# Patient Record
Sex: Male | Born: 1995 | Race: Black or African American | Hispanic: No | Marital: Single | Smoking: Never smoker
Health system: Southern US, Community
[De-identification: ages and names within clinical notes are randomized; demographics above are authoritative.]

---
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
## 2018-04-20 ENCOUNTER — Other Ambulatory Visit: Payer: Self-pay

## 2018-04-20 ENCOUNTER — Encounter (HOSPITAL_COMMUNITY): Payer: Self-pay

## 2018-04-20 ENCOUNTER — Inpatient Hospital Stay (HOSPITAL_COMMUNITY)
Admission: EM | Admit: 2018-04-20 | Discharge: 2018-04-23 | DRG: 482 | Disposition: A | Discharge status: Home / Self Care | Payer: No Typology Code available for payment source | Attending: Orthopedic Surgery | Admitting: Orthopedic Surgery

## 2018-04-20 ENCOUNTER — Emergency Department (HOSPITAL_COMMUNITY): Payer: No Typology Code available for payment source

## 2018-04-20 DIAGNOSIS — S7291XA Unspecified fracture of right femur, initial encounter for closed fracture: Secondary | ICD-10-CM | POA: Diagnosis present

## 2018-04-20 DIAGNOSIS — S72351A Displaced comminuted fracture of shaft of right femur, initial encounter for closed fracture: Principal | ICD-10-CM | POA: Diagnosis present

## 2018-04-20 DIAGNOSIS — T1490XA Injury, unspecified, initial encounter: Secondary | ICD-10-CM

## 2018-04-20 DIAGNOSIS — Y9351 Activity, roller skating (inline) and skateboarding: Secondary | ICD-10-CM | POA: Diagnosis not present

## 2018-04-20 DIAGNOSIS — M25551 Pain in right hip: Secondary | ICD-10-CM | POA: Diagnosis present

## 2018-04-20 DIAGNOSIS — Y92413 State road as the place of occurrence of the external cause: Secondary | ICD-10-CM | POA: Diagnosis not present

## 2018-04-20 LAB — BASIC METABOLIC PANEL
ANION GAP: 12 (ref 5–15)
BUN: 10 mg/dL (ref 6–20)
CO2: 24 mmol/L (ref 22–32)
Calcium: 9.5 mg/dL (ref 8.9–10.3)
Chloride: 105 mmol/L (ref 98–111)
Creatinine, Ser: 0.94 mg/dL (ref 0.61–1.24)
GFR calc Af Amer: >60 mL/min (ref >60)
GFR calc non Af Amer: >60 mL/min (ref >60)
GLUCOSE: 101 mg/dL — AB (ref 70–99)
Potassium: 4.1 mmol/L (ref 3.5–5.1)
Sodium: 141 mmol/L (ref 135–145)

## 2018-04-20 LAB — CBC
HCT: 47.4 % (ref 39.0–52.0)
HEMOGLOBIN: 16 g/dL (ref 13.0–17.0)
MCH: 30 pg (ref 26.0–34.0)
MCHC: 33.8 g/dL (ref 30.0–36.0)
MCV: 88.8 fL (ref 78.0–100.0)
Platelets: 183 10*3/uL (ref 150–400)
RBC: 5.34 MIL/uL (ref 4.22–5.81)
RDW: 12.3 % (ref 11.5–15.5)
WBC: 9.9 10*3/uL (ref 4.0–10.5)

## 2018-04-20 MED ORDER — METHOCARBAMOL 1000 MG/10ML IJ SOLN
500.0000 mg | Freq: Four times a day (QID) | INTRAVENOUS | Status: DC | PRN
  Filled 2018-04-20: qty 5

## 2018-04-20 MED ORDER — ONDANSETRON HCL 4 MG/2ML IJ SOLN
4.0000 mg | Freq: Once | INTRAMUSCULAR | Status: AC
  Administered 2018-04-20: 4 mg via INTRAVENOUS
  Filled 2018-04-20: qty 2

## 2018-04-20 MED ORDER — ACETAMINOPHEN 325 MG PO TABS: 650.0000 mg | ORAL_TABLET | Freq: Four times a day (QID) | ORAL | Status: DC | PRN

## 2018-04-20 MED ORDER — METHOCARBAMOL 500 MG PO TABS
500.0000 mg | ORAL_TABLET | Freq: Four times a day (QID) | ORAL | Status: DC | PRN
  Administered 2018-04-23 (×2): 500 mg via ORAL
  Filled 2018-04-20: qty 1

## 2018-04-20 MED ORDER — HYDROMORPHONE HCL 1 MG/ML IJ SOLN
0.5000 mg | INTRAMUSCULAR | Status: DC | PRN
  Administered 2018-04-22 (×2): 1 mg via INTRAVENOUS
  Filled 2018-04-20 (×2): qty 1

## 2018-04-20 MED ORDER — ACETAMINOPHEN 650 MG RE SUPP: 650.0000 mg | Freq: Four times a day (QID) | RECTAL | Status: DC | PRN

## 2018-04-20 MED ORDER — HYDROMORPHONE HCL 1 MG/ML IJ SOLN
1.0000 mg | Freq: Once | INTRAMUSCULAR | Status: AC
  Administered 2018-04-20: 1 mg via INTRAVENOUS
  Filled 2018-04-20: qty 1

## 2018-04-20 MED ORDER — HYDROCODONE-ACETAMINOPHEN 5-325 MG PO TABS: 1.0000 | ORAL_TABLET | ORAL | Status: DC | PRN

## 2018-04-20 MED ORDER — HYDROMORPHONE HCL 1 MG/ML IJ SOLN
1.0000 mg | INTRAMUSCULAR | Status: DC | PRN
  Administered 2018-04-20 – 2018-04-21 (×5): 1 mg via INTRAVENOUS
  Filled 2018-04-20 (×5): qty 1

## 2018-04-20 MED ORDER — METHOCARBAMOL 1000 MG/10ML IJ SOLN
500.0000 mg | Freq: Four times a day (QID) | INTRAVENOUS | Status: DC
  Administered 2018-04-20 – 2018-04-21 (×3): 500 mg via INTRAVENOUS
  Filled 2018-04-20 (×7): qty 5

## 2018-04-20 MED ORDER — FENTANYL CITRATE (PF) 100 MCG/2ML IJ SOLN: 25.0000 ug | INTRAMUSCULAR | Status: DC | PRN

## 2018-04-20 MED ORDER — SODIUM CHLORIDE 0.9 % IV SOLN
Freq: Once | INTRAVENOUS | Status: AC
  Administered 2018-04-20: 21:00:00 via INTRAVENOUS

## 2018-04-20 MED ORDER — METHOCARBAMOL 1000 MG/10ML IJ SOLN: 500.0000 mg | Freq: Four times a day (QID) | INTRAMUSCULAR | Status: DC

## 2018-04-20 MED ORDER — DOCUSATE SODIUM 100 MG PO CAPS
100.0000 mg | ORAL_CAPSULE | Freq: Two times a day (BID) | ORAL | Status: DC
  Administered 2018-04-20 – 2018-04-21 (×2): 100 mg via ORAL
  Filled 2018-04-20 (×2): qty 1

## 2018-04-20 MED ORDER — POTASSIUM CHLORIDE IN NACL 20-0.45 MEQ/L-% IV SOLN
INTRAVENOUS | Status: DC
  Administered 2018-04-21: 10:00:00 via INTRAVENOUS
  Filled 2018-04-20 (×2): qty 1000

## 2018-04-20 NOTE — ED Triage Notes (Signed)
Pt BIB ems for skateboarding injury to right leg with obvious deformity to femur and severe swelling. Pulses palpable, sensation intact. Pt denies LOC or hitting his head during injury. Pt given fentanyl en route. Pt a.o upon arrival, nad.VSS

## 2018-04-20 NOTE — Progress Notes (Signed)
Orthopedic Tech Progress Note Patient Details:  Mason Hawkins June 08, 1996 [id number]  Musculoskeletal Traction Type of Traction: Bucks Skin Traction Traction Location: rle Traction Weight: 10 lbs   Post Interventions Patient Tolerated: Well Instructions Provided: Care of device, Adjustment of device   Trinna PostMartinez, Starletta Houchin J 04/20/2018, 11:21 PM

## 2018-04-20 NOTE — H&P (Signed)
Mason Hawkins is an 22 y.o. male.    Chief Complaint:  Right femur fracture   HPI: Pt is a 22 y.o. male complaining of right thigh pain after falling while skating.  Immediate onset of pain, inability to bear weight and brought to ER by EMS. ER evaluation revealed right proximal third femur fracture  PCP:  Panosh, Standley Brooking, MD  D/C Plans: To be determined following appropriate treatment plan  PMH: History reviewed. No pertinent past medical history.  PSH: History reviewed. No pertinent surgical history.  Social History:  has no tobacco, alcohol, and drug history on file.  Allergies:  Allergies  Allergen Reactions  . Peanut-Containing Drug Products Other (See Comments)    Scratchy throat     Medications:  (Not in a hospital admission)  Results for orders placed or performed during the hospital encounter of 04/20/18 (from the past 48 hour(s))  CBC     Status: None   Collection Time: 04/20/18  6:50 PM  Result Value Ref Range   WBC 9.9 4.0 - 10.5 K/uL   RBC 5.34 4.22 - 5.81 MIL/uL   Hemoglobin 16.0 13.0 - 17.0 g/dL   HCT 47.4 39.0 - 52.0 %   MCV 88.8 78.0 - 100.0 fL   MCH 30.0 26.0 - 34.0 pg   MCHC 33.8 30.0 - 36.0 g/dL   RDW 12.3 11.5 - 15.5 %   Platelets 183 150 - 400 K/uL    Comment: Performed at [facility] [address], [place], [place] [address]  Basic metabolic panel     Status: Abnormal   Collection Time: 04/20/18  6:50 PM  Result Value Ref Range   Sodium 141 135 - 145 mmol/L   Potassium 4.1 3.5 - 5.1 mmol/L    Comment: HEMOLYSIS AT THIS LEVEL MAY AFFECT RESULT   Chloride 105 98 - 111 mmol/L   CO2 24 22 - 32 mmol/L   Glucose, Bld 101 (H) 70 - 99 mg/dL   BUN 10 6 - 20 mg/dL   Creatinine, Ser 0.94 0.61 - 1.24 mg/dL   Calcium 9.5 8.9 - 10.3 mg/dL   GFR calc non Af Amer >60 >60 mL/min   GFR calc Af Amer >60 >60 mL/min    Comment: (NOTE) The eGFR has been calculated using the CKD EPI equation. This calculation has not been validated in all  clinical situations. eGFR's persistently <60 mL/min signify possible Chronic Kidney Disease.    Anion gap 12 5 - 15    Comment: Performed at [facility] [address], [place], [place] [address]   Dg Chest 1 View  Result Date: 04/20/2018 CLINICAL DATA:  Pain after skateboarding accident. EXAM: CHEST  1 VIEW COMPARISON:  Report from 04/09/2000 FINDINGS: The heart size and mediastinal contours are within normal limits. Both lungs are clear. The visualized skeletal structures are unremarkable. IMPRESSION: No active disease. Electronically Signed   By: Ashley Royalty M.D.   On: 04/20/2018 18:30   Dg Femur, Min 2 Views Right  Result Date: 04/20/2018 CLINICAL DATA:  Right leg pain after skateboarding accident EXAM: RIGHT FEMUR 2 VIEWS COMPARISON:  None. FINDINGS: Acute, closed, comminuted but predominantly transverse fracture of the proximal right femur at the junction of the proximal and middle third. Medial displacement of the distal fracture fragment by 1/2 shaft width is identified with varus and dorsal angulation of the distal fracture fragment. No joint dislocation. Soft tissue swelling is identified. IMPRESSION: Acute, slightly comminuted, medially displaced, varus  and dorsally angulated fracture at the junction of the proximal and middle third of the right femur as above described. Electronically Signed   By: Ashley Royalty M.D.   On: 04/20/2018 18:32    ROS: Review of Systems - Negative except HPI  Physican Exam: Blood pressure 128/76, pulse 100, temperature 98.6 F (37 C), temperature source Oral, resp. rate 16, height _0  (1.6 m), weight 77.1 kg, SpO2 94 %.  Awake alert pain Right LE shortened, swelling NVI RLE General medicall exam per ER, stable No UE injury, deformity or bruising  Assessment/Plan Assessment: Right closed proximal third femur fracture   Plan: Admit Bucks traction for comfort NPO after MN for OR tomorrow Hold DVT chemoprophylaxis SCD LLE Will ask for  availability of Trauma service to address earlier in day   Mason Hawkins. Mason Dame, MD  04/20/2018, 9:38 PM

## 2018-04-20 NOTE — ED Provider Notes (Signed)
[facility] Provider Note   CSN: [id number] Arrival date & time: 04/20/18  1732     History   Chief Complaint Chief Complaint  Patient presents with  . Leg Injury    HPI Mason Hawkins is a 22 y.o. male.  HPI   Mason Hawkins is a 22 year old male with no significant past medical history who presents to the emergency department for evaluation of right leg injury.  Patient reports that he was skateboarding 1 hour prior to arrival when he accidentally hit a small hole in the road.  His right foot stepped in a hole and he immediately felt pain in his right thigh.  He lowered himself to the ground and has been unable to bear weight ever since.  EMS noticed an obvious deformity in his right thigh and he was given fentanyl in route.  Patient reports his pain is improved since being given the fentanyl.  His pain is located in the anterior middle right thigh, worsened with palpation.  Denies hitting his head or losing consciousness.  He denies pain or arthralgias elsewhere.  No open wounds.  She denies numbness, headache, neck pain, back pain, chest pain, shortness of breath, abdominal pain.  He last ate/drink around 12 PM today.  History reviewed. No pertinent past medical history.  Patient Active Problem List   Diagnosis Date Noted  . INFECTION, TOE 10/07/2007    History reviewed. No pertinent surgical history.      Home Medications    Prior to Admission medications   Not on File    Family History No family history on file.  Social History Social History   Tobacco Use  . Smoking status: Not on file  Substance Use Topics  . Alcohol use: Not on file  . Drug use: Not on file     Allergies   Peanut-containing drug products   Review of Systems Review of Systems  Constitutional: Negative for chills and fever.  Eyes: Negative for visual disturbance.  Respiratory: Negative for shortness of breath.   Cardiovascular: Negative  for chest pain.  Gastrointestinal: Negative for abdominal pain, nausea and vomiting.  Genitourinary: Negative for difficulty urinating.  Musculoskeletal: Positive for arthralgias (right thigh) and gait problem. Negative for back pain.  Skin: Negative for color change and wound.  Neurological: Negative for numbness.  Psychiatric/Behavioral: Negative for agitation.     Physical Exam Updated Vital Signs BP 133/74 (BP Location: Right Arm)   Pulse 93   Temp 98.6 F (37 C) (Oral)   Resp 16   Ht 5\' 3"  (1.6 m)   Wt 77.1 kg   SpO2 95%   BMI 30.11 kg/m   Physical Exam  Constitutional: He appears well-developed and well-nourished. No distress.  No acute distress.   HENT:  Head: Normocephalic and atraumatic.  Eyes: Right eye exhibits no discharge. Left eye exhibits no discharge.  Neck: Normal range of motion. Neck supple.  Cardiovascular: Normal rate, regular rhythm and intact distal pulses.  Pulmonary/Chest: Effort normal and breath sounds normal. No stridor. No respiratory distress. He has no wheezes. He has no rales.  Abdominal: Soft. Bowel sounds are normal. There is no tenderness.  Musculoskeletal:  Right thigh swollen and tender to palpation over the mid-thigh. There is palpable deformity over the middle lateral thigh. Compartments soft. No right knee or ankle pain with palpation. DP pulses 2+ and symmetric bilaterally. Sensation to light touch intact in bilateral LE.   Neurological: He is alert. Coordination  normal.  Skin: Skin is warm and dry. He is not diaphoretic.  Psychiatric: He has a normal mood and affect. His behavior is normal.  Nursing note and vitals reviewed.    ED Treatments / Results  Labs (all labs ordered are listed, but only abnormal results are displayed) Labs Reviewed  CBC  BASIC METABOLIC PANEL    EKG EKG Interpretation  Date/Time:  Monday April 20 2018 18:59:52 EDT Ventricular Rate:  100 PR Interval:    QRS Duration: 69 QT  Interval:  315 QTC Calculation: 407 R Axis:   54 Text Interpretation:  Sinus tachycardia ST elev, probable normal early repol pattern Confirmed by Kennis Carina [phone]) on 04/20/2018 7:12:33 PM   Radiology Dg Chest 1 View  Result Date: 04/20/2018 CLINICAL DATA:  Pain after skateboarding accident. EXAM: CHEST  1 VIEW COMPARISON:  Report from 04/09/2000 FINDINGS: The heart size and mediastinal contours are within normal limits. Both lungs are clear. The visualized skeletal structures are unremarkable. IMPRESSION: No active disease. Electronically Signed   By: Tollie Eth M.D.   On: 04/20/2018 18:30   Dg Femur, Min 2 Views Right  Result Date: 04/20/2018 CLINICAL DATA:  Right leg pain after skateboarding accident EXAM: RIGHT FEMUR 2 VIEWS COMPARISON:  None. FINDINGS: Acute, closed, comminuted but predominantly transverse fracture of the proximal right femur at the junction of the proximal and middle third. Medial displacement of the distal fracture fragment by 1/2 shaft width is identified with varus and dorsal angulation of the distal fracture fragment. No joint dislocation. Soft tissue swelling is identified. IMPRESSION: Acute, slightly comminuted, medially displaced, varus and dorsally angulated fracture at the junction of the proximal and middle third of the right femur as above described. Electronically Signed   By: Tollie Eth M.D.   On: 04/20/2018 18:32    Procedures Procedures (including critical care time)  CRITICAL CARE Performed by: Kellie Shropshire   Total critical care time: 35 minutes  Critical care time was exclusive of separately billable procedures and treating other patients.  Critical care was necessary to treat or prevent imminent or life-threatening deterioration.  Critical care was time spent personally by me on the following activities: development of treatment plan with patient and/or surrogate as well as nursing, discussions with consultants, evaluation of patient's  response to treatment, examination of patient, obtaining history from patient or surrogate, ordering and performing treatments and interventions, ordering and review of laboratory studies, ordering and review of radiographic studies, pulse oximetry and re-evaluation of patient's condition.   Medications Ordered in ED Medications  0.9 %  sodium chloride infusion (has no administration in time range)  HYDROmorphone (DILAUDID) injection 1 mg (has no administration in time range)  methocarbamol (ROBAXIN) 500 mg in dextrose 5 % 50 mL IVPB (has no administration in time range)  ondansetron (ZOFRAN) injection 4 mg (4 mg Intravenous Given 04/20/18 1850)  HYDROmorphone (DILAUDID) injection 1 mg (1 mg Intravenous Given 04/20/18 1851)     Initial Impression / Assessment and Plan / ED Course  I have reviewed the triage vital signs and the nursing notes.  Pertinent labs & imaging results that were available during my care of the patient were reviewed by me and considered in my medical decision making (see chart for details).     Patient with closed right femur fracture which is medially displaced at the junction of the proximal and middle third of the femur.  Compartments soft and extremity is neurovascularly intact.  Discussed this patient with orthopedist  Dr. Charlann Boxer who would like patient admitted to the orthopedic service for planned surgical intervention either tonight or tomorrow.  He would like patient kept n.p.o.  I have placed a temporary admission orders per his request.  Chest x-ray, CBC and BMP unremarkable.  Patient informed of this plan and he agrees.  This was a shared visit with Dr. Pilar Plate who also saw the patient and agrees with admission.  Final Clinical Impressions(s) / ED Diagnoses   Final diagnoses:  Closed displaced comminuted fracture of shaft of right femur, initial encounter [facility])    ED Discharge Orders    None       Kellie Shropshire, PA-C 04/20/18 2015    Sabas Sous,  MD 04/20/18 2214

## 2018-04-21 ENCOUNTER — Encounter (HOSPITAL_COMMUNITY)
Admission: EM | Disposition: A | Discharge status: Home / Self Care | Payer: Self-pay | Source: Home / Self Care | Attending: Orthopedic Surgery

## 2018-04-21 ENCOUNTER — Inpatient Hospital Stay (HOSPITAL_COMMUNITY): Payer: No Typology Code available for payment source | Admitting: Anesthesiology

## 2018-04-21 ENCOUNTER — Encounter (HOSPITAL_COMMUNITY): Payer: Self-pay | Admitting: *Deleted

## 2018-04-21 ENCOUNTER — Inpatient Hospital Stay (HOSPITAL_COMMUNITY): Payer: No Typology Code available for payment source

## 2018-04-21 HISTORY — PX: FEMUR IM NAIL: SHX1597

## 2018-04-21 LAB — CBC
HEMATOCRIT: 43.6 % (ref 39.0–52.0)
Hemoglobin: 14.5 g/dL (ref 13.0–17.0)
MCH: 29.6 pg (ref 26.0–34.0)
MCHC: 33.3 g/dL (ref 30.0–36.0)
MCV: 89 fL (ref 78.0–100.0)
Platelets: 178 10*3/uL (ref 150–400)
RBC: 4.9 MIL/uL (ref 4.22–5.81)
RDW: 12.5 % (ref 11.5–15.5)
WBC: 10.5 10*3/uL (ref 4.0–10.5)

## 2018-04-21 LAB — HIV ANTIBODY (ROUTINE TESTING W REFLEX): HIV Screen 4th Generation wRfx: NONREACTIVE

## 2018-04-21 SURGERY — INSERTION, INTRAMEDULLARY ROD, FEMUR
Anesthesia: General | Site: Leg Upper | Laterality: Right

## 2018-04-21 MED ORDER — CEFAZOLIN SODIUM-DEXTROSE 2-3 GM-%(50ML) IV SOLR
INTRAVENOUS | Status: DC | PRN
  Administered 2018-04-21: 2 g via INTRAVENOUS

## 2018-04-21 MED ORDER — FENTANYL CITRATE (PF) 100 MCG/2ML IJ SOLN
INTRAMUSCULAR | Status: AC
  Filled 2018-04-21: qty 2

## 2018-04-21 MED ORDER — ARTIFICIAL TEARS OPHTHALMIC OINT
TOPICAL_OINTMENT | OPHTHALMIC | Status: AC
  Filled 2018-04-21: qty 3.5

## 2018-04-21 MED ORDER — PHENYLEPHRINE 40 MCG/ML (10ML) SYRINGE FOR IV PUSH (FOR BLOOD PRESSURE SUPPORT)
PREFILLED_SYRINGE | INTRAVENOUS | Status: AC
  Filled 2018-04-21: qty 30

## 2018-04-21 MED ORDER — OXYCODONE HCL 5 MG PO TABS: 5.0000 mg | ORAL_TABLET | Freq: Once | ORAL | Status: DC | PRN

## 2018-04-21 MED ORDER — PROPOFOL 10 MG/ML IV BOLUS
INTRAVENOUS | Status: DC | PRN
  Administered 2018-04-21: 200 mg via INTRAVENOUS

## 2018-04-21 MED ORDER — SUGAMMADEX SODIUM 200 MG/2ML IV SOLN
INTRAVENOUS | Status: DC | PRN
  Administered 2018-04-21: 200 mg via INTRAVENOUS

## 2018-04-21 MED ORDER — FENTANYL CITRATE (PF) 250 MCG/5ML IJ SOLN
INTRAMUSCULAR | Status: DC | PRN
  Administered 2018-04-21 (×5): 50 ug via INTRAVENOUS

## 2018-04-21 MED ORDER — 0.9 % SODIUM CHLORIDE (POUR BTL) OPTIME
TOPICAL | Status: DC | PRN
  Administered 2018-04-21: 1000 mL

## 2018-04-21 MED ORDER — SODIUM CHLORIDE 0.9 % IV SOLN
INTRAVENOUS | Status: DC
  Administered 2018-04-22 (×2): via INTRAVENOUS

## 2018-04-21 MED ORDER — MIDAZOLAM HCL 5 MG/5ML IJ SOLN
INTRAMUSCULAR | Status: DC | PRN
  Administered 2018-04-21 (×2): 1 mg via INTRAVENOUS

## 2018-04-21 MED ORDER — SUCCINYLCHOLINE CHLORIDE 200 MG/10ML IV SOSY
PREFILLED_SYRINGE | INTRAVENOUS | Status: AC
  Filled 2018-04-21: qty 20

## 2018-04-21 MED ORDER — PROPOFOL 10 MG/ML IV BOLUS
INTRAVENOUS | Status: AC
  Filled 2018-04-21: qty 20

## 2018-04-21 MED ORDER — ONDANSETRON HCL 4 MG/2ML IJ SOLN
INTRAMUSCULAR | Status: DC | PRN
  Administered 2018-04-21: 4 mg via INTRAVENOUS

## 2018-04-21 MED ORDER — LIDOCAINE 2% (20 MG/ML) 5 ML SYRINGE
INTRAMUSCULAR | Status: AC
  Filled 2018-04-21: qty 10

## 2018-04-21 MED ORDER — ONDANSETRON HCL 4 MG/2ML IJ SOLN
INTRAMUSCULAR | Status: AC
  Filled 2018-04-21: qty 4

## 2018-04-21 MED ORDER — FENTANYL CITRATE (PF) 100 MCG/2ML IJ SOLN
25.0000 ug | INTRAMUSCULAR | Status: DC | PRN
  Administered 2018-04-21 (×2): 50 ug via INTRAVENOUS

## 2018-04-21 MED ORDER — ROCURONIUM BROMIDE 50 MG/5ML IV SOSY
PREFILLED_SYRINGE | INTRAVENOUS | Status: AC
  Filled 2018-04-21: qty 5

## 2018-04-21 MED ORDER — ROCURONIUM BROMIDE 100 MG/10ML IV SOLN
INTRAVENOUS | Status: DC | PRN
  Administered 2018-04-21: 50 mg via INTRAVENOUS

## 2018-04-21 MED ORDER — CEFAZOLIN SODIUM 1 G IJ SOLR
INTRAMUSCULAR | Status: AC
  Filled 2018-04-21: qty 20

## 2018-04-21 MED ORDER — ONDANSETRON HCL 4 MG/2ML IJ SOLN: 4.0000 mg | Freq: Once | INTRAMUSCULAR | Status: DC | PRN

## 2018-04-21 MED ORDER — LIDOCAINE HCL (CARDIAC) PF 100 MG/5ML IV SOSY
PREFILLED_SYRINGE | INTRAVENOUS | Status: DC | PRN
  Administered 2018-04-21: 40 mg via INTRATRACHEAL

## 2018-04-21 MED ORDER — MIDAZOLAM HCL 2 MG/2ML IJ SOLN
INTRAMUSCULAR | Status: AC
  Filled 2018-04-21: qty 2

## 2018-04-21 MED ORDER — CEFAZOLIN SODIUM-DEXTROSE 2-4 GM/100ML-% IV SOLN
2.0000 g | Freq: Four times a day (QID) | INTRAVENOUS | Status: AC
  Administered 2018-04-22 (×2): 2 g via INTRAVENOUS
  Filled 2018-04-21 (×3): qty 100

## 2018-04-21 MED ORDER — LACTATED RINGERS IV SOLN
INTRAVENOUS | Status: DC | PRN
  Administered 2018-04-21 (×2): via INTRAVENOUS

## 2018-04-21 MED ORDER — OXYCODONE HCL 5 MG/5ML PO SOLN: 5.0000 mg | Freq: Once | ORAL | Status: DC | PRN

## 2018-04-21 MED ORDER — DIPHENHYDRAMINE HCL 50 MG/ML IJ SOLN
INTRAMUSCULAR | Status: AC
  Filled 2018-04-21: qty 1

## 2018-04-21 MED ORDER — DIPHENHYDRAMINE HCL 50 MG/ML IJ SOLN
INTRAMUSCULAR | Status: DC | PRN
  Administered 2018-04-21: 12.5 mg via INTRAVENOUS

## 2018-04-21 MED ORDER — FENTANYL CITRATE (PF) 250 MCG/5ML IJ SOLN
INTRAMUSCULAR | Status: AC
  Filled 2018-04-21: qty 5

## 2018-04-21 SURGICAL SUPPLY — 50 items
BIT DRILL 3.8X6 NS (BIT) ×2 IMPLANT
BIT DRILL 5.3 (BIT) ×2 IMPLANT
BIT DRILL 5.3 NS (BIT) ×2 IMPLANT
COVER PERINEAL POST (MISCELLANEOUS) ×3 IMPLANT
DRAPE INCISE IOBAN 66X45 STRL (DRAPES) ×2 IMPLANT
DRAPE STERI IOBAN 125X83 (DRAPES) ×3 IMPLANT
DRAPE SURG 17X23 STRL (DRAPES) ×3 IMPLANT
DRSG ADAPTIC 3X8 NADH LF (GAUZE/BANDAGES/DRESSINGS) ×3 IMPLANT
DRSG MEPILEX BORDER 4X12 (GAUZE/BANDAGES/DRESSINGS) IMPLANT
DRSG MEPILEX BORDER 4X4 (GAUZE/BANDAGES/DRESSINGS) ×6 IMPLANT
DRSG MEPILEX BORDER 4X8 (GAUZE/BANDAGES/DRESSINGS) IMPLANT
DURAPREP 26ML APPLICATOR (WOUND CARE) ×3 IMPLANT
ELECT REM PT RETURN 9FT ADLT (ELECTROSURGICAL) ×3
ELECTRODE REM PT RTRN 9FT ADLT (ELECTROSURGICAL) ×1 IMPLANT
EVACUATOR 1/8 PVC DRAIN (DRAIN) IMPLANT
GAUZE SPONGE 4X4 12PLY STRL (GAUZE/BANDAGES/DRESSINGS) ×3 IMPLANT
GLOVE BIOGEL PI IND STRL 7.5 (GLOVE) ×1 IMPLANT
GLOVE BIOGEL PI IND STRL 8 (GLOVE) ×1 IMPLANT
GLOVE BIOGEL PI INDICATOR 7.5 (GLOVE) ×2
GLOVE BIOGEL PI INDICATOR 8 (GLOVE) ×2
GLOVE ORTHO TXT STRL SZ7.5 (GLOVE) ×3 IMPLANT
GLOVE SURG ORTHO 8.0 STRL STRW (GLOVE) ×3 IMPLANT
GOWN STRL REUS W/ TWL LRG LVL3 (GOWN DISPOSABLE) ×3 IMPLANT
GOWN STRL REUS W/TWL LRG LVL3 (GOWN DISPOSABLE) ×6
GUIDEPIN 3.2X17.5 THRD DISP (PIN) ×2 IMPLANT
GUIDEWIRE BALL NOSE 100CM (WIRE) ×2 IMPLANT
KIT BASIN OR (CUSTOM PROCEDURE TRAY) ×3 IMPLANT
KIT TURNOVER KIT B (KITS) ×3 IMPLANT
LINER BOOT UNIVERSAL DISP (MISCELLANEOUS) ×3 IMPLANT
MANIFOLD NEPTUNE II (INSTRUMENTS) ×3 IMPLANT
NAIL TROCH RH 9X34 (Nail) ×2 IMPLANT
NS IRRIG 1000ML POUR BTL (IV SOLUTION) ×3 IMPLANT
PACK GENERAL/GYN (CUSTOM PROCEDURE TRAY) ×3 IMPLANT
PAD ARMBOARD 7.5X6 YLW CONV (MISCELLANEOUS) ×6 IMPLANT
REAMER ONE STEP 12.2MM (BIT) ×2 IMPLANT
SCREW ACE CORTICAL (Screw) ×2 IMPLANT
SCREW ACE CORTICAL 6.5X70MML (Screw) ×2 IMPLANT
SCREW ACECAP 42MM (Screw) ×2 IMPLANT
SCREW BN FT 60X6.5XST DRV (Screw) IMPLANT
SCREWDRIVER HEX TIP 3.5MM (MISCELLANEOUS) ×2 IMPLANT
STAPLER VISISTAT 35W (STAPLE) ×3 IMPLANT
SUT VIC AB 0 CT1 27 (SUTURE) ×4
SUT VIC AB 0 CT1 27XBRD ANBCTR (SUTURE) ×2 IMPLANT
SUT VIC AB 1 CT1 27 (SUTURE) ×2
SUT VIC AB 1 CT1 27XBRD ANBCTR (SUTURE) ×1 IMPLANT
SUT VIC AB 2-0 CT1 27 (SUTURE) ×2
SUT VIC AB 2-0 CT1 TAPERPNT 27 (SUTURE) ×1 IMPLANT
TOWEL OR 17X24 6PK STRL BLUE (TOWEL DISPOSABLE) ×3 IMPLANT
TOWEL OR 17X26 10 PK STRL BLUE (TOWEL DISPOSABLE) ×3 IMPLANT
WATER STERILE IRR 1000ML POUR (IV SOLUTION) ×3 IMPLANT

## 2018-04-21 NOTE — Interval H&P Note (Signed)
History and Physical Interval Note:  04/21/2018 9:15 PM  Mason Hawkins  has presented today for surgery, with the diagnosis of Right femur fx  The various methods of treatment have been discussed with the patient and family. After consideration of risks, benefits and other options for treatment, the patient has consented to  Procedure(s): INTRAMEDULLARY (IM) NAIL FEMORAL (Right) as a surgical intervention .  The patient's history has been reviewed, patient examined, no change in status, stable for surgery.  I have reviewed the patient's chart and labs.  Questions were answered to the patient's satisfaction.     Shelda PalMatthew D Kelani Robart

## 2018-04-21 NOTE — Progress Notes (Signed)
Patient ID: Mason Hawkins, male   DOB: 02/17/1996, 22 y.o.   MRN: [id number]  Subjective:  A little more comfortable this am with some pain meds     Patient reports pain as moderate.   No other complaints  Objective:   VITALS:   Vitals:   04/20/18 2227 04/21/18 0451  BP: (!) 144/86 122/72  Pulse: 93 100  Resp: 16 16  Temp: 97.9 F (36.6 C) 99.3 F (37.4 C)  SpO2: 98% 98%    Neurovascular intact  In Bucks traction  LABS Recent Labs    04/20/18 1850 04/21/18 0434  HGB 16.0 14.5  HCT 47.4 43.6  WBC 9.9 10.5  PLT 183 178    Recent Labs    04/20/18 1850  NA 141  K 4.1  BUN 10  CREATININE 0.94  GLUCOSE 101*    No results for input(s): LABPT, INR in the last 72 hours.   Assessment/Plan: Right closed proximal femoral shaft fracture   Plan: NPO Will need ORIF Checking on availability of Trauma team If not will address later this pm Risks and benefits reviewed Post op expectations reviewed 

## 2018-04-21 NOTE — Transfer of Care (Signed)
Immediate Anesthesia Transfer of Care Note  Patient: Mason Hawkins  Procedure(s) Performed: INTRAMEDULLARY (IM) NAIL FEMORAL (Right Leg Upper)  Patient Location: PACU  Anesthesia Type:General  Level of Consciousness: drowsy  Airway & Oxygen Therapy: Patient Spontanous Breathing  Post-op Assessment: Report given to RN and Post -op Vital signs reviewed and stable  Post vital signs: Reviewed and stable  Last Vitals:  Vitals Value Taken Time  BP 149/82 04/21/2018 11:30 PM  Temp    Pulse 116 04/21/2018 11:32 PM  Resp 24 04/21/2018 11:32 PM  SpO2 100 % 04/21/2018 11:32 PM  Vitals shown include unvalidated device data.  Last Pain:  Vitals:   04/21/18 1000  TempSrc:   PainSc: 8          Complications: No apparent anesthesia complications

## 2018-04-21 NOTE — Anesthesia Procedure Notes (Signed)
Procedure Name: Intubation Date/Time: 04/21/2018 10:06 PM Performed by: Claudina LickMahony, Zebulan Hinshaw D, CRNA Pre-anesthesia Checklist: Patient identified, Emergency Drugs available, Suction available, Patient being monitored and Timeout performed Patient Re-evaluated:Patient Re-evaluated prior to induction Oxygen Delivery Method: Circle system utilized Preoxygenation: Pre-oxygenation with 100% oxygen Induction Type: IV induction Ventilation: Mask ventilation without difficulty Laryngoscope Size: Miller and 2 Grade View: Grade I Tube type: Oral Tube size: 7.5 mm Number of attempts: 1 Airway Equipment and Method: Stylet Placement Confirmation: ETT inserted through vocal cords under direct vision,  positive ETCO2 and breath sounds checked- equal and bilateral Secured at: 22 cm Tube secured with: Tape Dental Injury: Teeth and Oropharynx as per pre-operative assessment

## 2018-04-21 NOTE — Care Management Note (Signed)
Case Management Note  Patient Details  Name: Mason Hawkins MRN: [id number] Date of Birth: [birth year]  Subjective/Objective:     Right closed proximal femoral shaft fracture               Action/Plan: Independent prior to admission, patient injured himself after falling while skating; [facility]; CM will continue to follow for progression of care.  Expected Discharge Date:    possibly 04/25/2018              Expected Discharge Plan:   Home  Status of Service:   In progress  Reola MosherChandler, Viyaan Champine L, RN,MHA,BSN [phone] 04/21/2018, 12:47 PM

## 2018-04-21 NOTE — Plan of Care (Signed)
  Problem: Education: Goal: Knowledge of General Education information will improve Description: Including pain rating scale, medication(s)/side effects and non-pharmacologic comfort measures Outcome: Progressing   Problem: Pain Managment: Goal: General experience of comfort will improve Outcome: Progressing   Problem: Safety: Goal: Ability to remain free from injury will improve Outcome: Progressing   

## 2018-04-21 NOTE — H&P (View-Only) (Signed)
Patient ID: Mason Hawkins, male   DOB: 15-Dec-1995, 22 y.o.   MRN: [id number]  Subjective:  A little more comfortable this am with some pain meds     Patient reports pain as moderate.   No other complaints  Objective:   VITALS:   Vitals:   04/20/18 2227 04/21/18 0451  BP: (!) 144/86 122/72  Pulse: 93 100  Resp: 16 16  Temp: 97.9 F (36.6 C) 99.3 F (37.4 C)  SpO2: 98% 98%    Neurovascular intact  In Bucks traction  LABS Recent Labs    04/20/18 1850 04/21/18 0434  HGB 16.0 14.5  HCT 47.4 43.6  WBC 9.9 10.5  PLT 183 178    Recent Labs    04/20/18 1850  NA 141  K 4.1  BUN 10  CREATININE 0.94  GLUCOSE 101*    No results for input(s): LABPT, INR in the last 72 hours.   Assessment/Plan: Right closed proximal femoral shaft fracture   Plan: NPO Will need ORIF Checking on availability of Trauma team If not will address later this pm Risks and benefits reviewed Post op expectations reviewed

## 2018-04-21 NOTE — Anesthesia Preprocedure Evaluation (Signed)

## 2018-04-21 NOTE — Brief Op Note (Signed)
04/20/2018 - 04/21/2018  9:17 PM  PATIENT:  Mason Hawkins  22 y.o. male  PRE-OPERATIVE DIAGNOSIS:  Right femur fx  POST-OPERATIVE DIAGNOSIS:  Right closed proximal femoral shaft fracture  PROCEDURE:  Procedure(s): INTRAMEDULLARY (IM) NAIL FEMORAL (Right)  SURGEON:  Surgeon(s) and Role:    Durene Romans* Tejah Brekke, MD - Primary  PHYSICIAN ASSISTANT: None  ANESTHESIA:   general  EBL:  200 cc  BLOOD ADMINISTERED:none  DRAINS: none   LOCAL MEDICATIONS USED:  NONE  SPECIMEN:  No Specimen  DISPOSITION OF SPECIMEN:  N/A  COUNTS:  YES  TOURNIQUET:  * No tourniquets in log *  DICTATION: .Other Dictation: Dictation Number [id number]  PLAN OF CARE: Admit to inpatient   PATIENT DISPOSITION:  PACU - hemodynamically stable.   Delay start of Pharmacological VTE agent (>24hrs) due to surgical blood loss or risk of bleeding: no

## 2018-04-22 ENCOUNTER — Encounter (HOSPITAL_COMMUNITY): Payer: Self-pay | Admitting: Orthopedic Surgery

## 2018-04-22 LAB — BASIC METABOLIC PANEL
Anion gap: 9 (ref 5–15)
BUN: 8 mg/dL (ref 6–20)
CHLORIDE: 101 mmol/L (ref 98–111)
CO2: 24 mmol/L (ref 22–32)
CREATININE: 0.93 mg/dL (ref 0.61–1.24)
Calcium: 8.3 mg/dL — ABNORMAL LOW (ref 8.9–10.3)
GFR calc non Af Amer: >60 mL/min (ref >60)
Glucose, Bld: 106 mg/dL — ABNORMAL HIGH (ref 70–99)
Potassium: 3.7 mmol/L (ref 3.5–5.1)
SODIUM: 134 mmol/L — AB (ref 135–145)

## 2018-04-22 LAB — CBC
HCT: 38.5 % — ABNORMAL LOW (ref 39.0–52.0)
HEMOGLOBIN: 12.9 g/dL — AB (ref 13.0–17.0)
MCH: 29.9 pg (ref 26.0–34.0)
MCHC: 33.5 g/dL (ref 30.0–36.0)
MCV: 89.3 fL (ref 78.0–100.0)
Platelets: 149 10*3/uL — ABNORMAL LOW (ref 150–400)
RBC: 4.31 MIL/uL (ref 4.22–5.81)
RDW: 12.4 % (ref 11.5–15.5)
WBC: 12.8 10*3/uL — ABNORMAL HIGH (ref 4.0–10.5)

## 2018-04-22 MED ORDER — METOCLOPRAMIDE HCL 5 MG PO TABS: 5.0000 mg | ORAL_TABLET | Freq: Three times a day (TID) | ORAL | Status: DC | PRN

## 2018-04-22 MED ORDER — ONDANSETRON HCL 4 MG/2ML IJ SOLN: 4.0000 mg | Freq: Four times a day (QID) | INTRAMUSCULAR | Status: DC | PRN

## 2018-04-22 MED ORDER — ACETAMINOPHEN 500 MG PO TABS
500.0000 mg | ORAL_TABLET | Freq: Four times a day (QID) | ORAL | Status: AC
  Administered 2018-04-22 (×3): 500 mg via ORAL
  Filled 2018-04-22 (×3): qty 1

## 2018-04-22 MED ORDER — ACETAMINOPHEN 325 MG PO TABS: 325.0000 mg | ORAL_TABLET | Freq: Four times a day (QID) | ORAL | Status: DC | PRN

## 2018-04-22 MED ORDER — METHOCARBAMOL 1000 MG/10ML IJ SOLN
500.0000 mg | Freq: Four times a day (QID) | INTRAVENOUS | Status: DC | PRN
  Filled 2018-04-22: qty 5

## 2018-04-22 MED ORDER — MENTHOL 3 MG MT LOZG: 1.0000 | LOZENGE | OROMUCOSAL | Status: DC | PRN

## 2018-04-22 MED ORDER — POLYETHYLENE GLYCOL 3350 17 G PO PACK: 17.0000 g | PACK | Freq: Every day | ORAL | Status: DC | PRN

## 2018-04-22 MED ORDER — METHOCARBAMOL 500 MG PO TABS
500.0000 mg | ORAL_TABLET | Freq: Four times a day (QID) | ORAL | Status: DC | PRN
  Filled 2018-04-22: qty 1

## 2018-04-22 MED ORDER — HYDROCODONE-ACETAMINOPHEN 5-325 MG PO TABS
1.0000 | ORAL_TABLET | ORAL | Status: DC | PRN
  Administered 2018-04-22: 2 via ORAL
  Administered 2018-04-23: 1 via ORAL
  Filled 2018-04-22: qty 1
  Filled 2018-04-22: qty 2

## 2018-04-22 MED ORDER — METOCLOPRAMIDE HCL 5 MG/ML IJ SOLN: 5.0000 mg | Freq: Three times a day (TID) | INTRAMUSCULAR | Status: DC | PRN

## 2018-04-22 MED ORDER — ASPIRIN EC 325 MG PO TBEC
325.0000 mg | DELAYED_RELEASE_TABLET | Freq: Every day | ORAL | Status: DC
  Administered 2018-04-22 – 2018-04-23 (×2): 325 mg via ORAL
  Filled 2018-04-22 (×2): qty 1

## 2018-04-22 MED ORDER — HYDROCODONE-ACETAMINOPHEN 7.5-325 MG PO TABS
1.0000 | ORAL_TABLET | ORAL | Status: DC | PRN
  Administered 2018-04-22 – 2018-04-23 (×3): 2 via ORAL
  Filled 2018-04-22 (×2): qty 1
  Filled 2018-04-22 (×2): qty 2

## 2018-04-22 MED ORDER — DOCUSATE SODIUM 100 MG PO CAPS
100.0000 mg | ORAL_CAPSULE | Freq: Two times a day (BID) | ORAL | Status: DC
  Administered 2018-04-22 – 2018-04-23 (×3): 100 mg via ORAL
  Filled 2018-04-22 (×3): qty 1

## 2018-04-22 MED ORDER — ONDANSETRON HCL 4 MG PO TABS: 4.0000 mg | ORAL_TABLET | Freq: Four times a day (QID) | ORAL | Status: DC | PRN

## 2018-04-22 MED ORDER — ALUM & MAG HYDROXIDE-SIMETH 200-200-20 MG/5ML PO SUSP: 30.0000 mL | ORAL | Status: DC | PRN

## 2018-04-22 MED ORDER — MORPHINE SULFATE (PF) 2 MG/ML IV SOLN
0.5000 mg | INTRAVENOUS | Status: DC | PRN
  Administered 2018-04-22: 1 mg via INTRAVENOUS
  Filled 2018-04-22: qty 1

## 2018-04-22 MED ORDER — PHENOL 1.4 % MT LIQD: 1.0000 | OROMUCOSAL | Status: DC | PRN

## 2018-04-22 NOTE — Plan of Care (Signed)

## 2018-04-22 NOTE — Progress Notes (Signed)
     Subjective: 1 Day Post-Op Procedure(s) (LRB): INTRAMEDULLARY (IM) NAIL FEMORAL (Right)   Patient reports pain as mild, states that he pain is really well controlled. Resting in bed comfortably.  No reported events throughout the night.     Objective:   VITALS:   Vitals:   04/22/18 0023 04/22/18 0421  BP: 130/75 132/72  Pulse: (!) 108 (!) 104  Resp: 16 16  Temp: (!) 100.4 F (38 C) 99.6 F (37.6 C)  SpO2: 97% 99%    Incision: dressing C/D/I No cellulitis present Compartment soft  LABS Recent Labs    04/20/18 1850 04/21/18 0434 04/22/18 0520  HGB 16.0 14.5 12.9*  HCT 47.4 43.6 38.5*  WBC 9.9 10.5 12.8*  PLT 183 178 149*    Recent Labs    04/20/18 1850 04/22/18 0520  NA 141 134*  K 4.1 3.7  BUN 10 8  CREATININE 0.94 0.93  GLUCOSE 101* 106*     Assessment/Plan: 1 Day Post-Op Procedure(s) (LRB): INTRAMEDULLARY (IM) NAIL FEMORAL (Right) Advance diet Up with therapy D/C IV fluids Discharge home, when ready probably tomorrow     Anastasio AuerbachMatthew S. Elianys Conry   PAC  04/22/2018, 7:40 AM

## 2018-04-22 NOTE — Evaluation (Signed)
Physical Therapy Evaluation Patient Details Name: Mason Hawkins O Duffett MRN: [id number] DOB: [birth year] Today's Date: 04/22/2018   History of Present Illness  Pt is a 22 y.o. male admitted 04/20/18 after falling off skateboard sustaining R proximal third femur fx; now s/p IM nail. No pertinent PMH on file.    Clinical Impression  Pt presents with an overall decrease in functional mobility secondary to above. PTA, pt indep, works, and lives with family available for 24/7 support. Educ on precautions, positioning, therex, and importance of mobility. Today, pt able to ambulate with RW at supervision-level; will plan for crutch and stair training next session. Pt would benefit from continued acute PT services to maximize functional mobility and independence prior to d/c home. Recommend follow-up with outpatient orthopedic PT for return to higher level functional and sport activity.    Follow Up Recommendations Outpatient PT;Supervision for mobility/OOB    Equipment Recommendations  Rolling walker with 5" wheels    Recommendations for Other Services       Precautions / Restrictions Precautions Precautions: Fall Restrictions Weight Bearing Restrictions: Yes RLE Weight Bearing: Weight bearing as tolerated      Mobility  Bed Mobility Overal bed mobility: Needs Assistance Bed Mobility: Supine to Sit     Supine to sit: Min assist     General bed mobility comments: MinA to assist RLE to EOB; educ on use of BUEs or LLE to assist  Transfers Overall transfer level: Needs assistance Equipment used: Rolling walker (2 wheeled) Transfers: Sit to/from Stand Sit to Stand: Supervision            Ambulation/Gait Ambulation/Gait assistance: Supervision Gait Distance (Feet): 30 Feet Assistive device: Rolling walker (2 wheeled) Gait Pattern/deviations: Step-to pattern;Decreased weight shift to right;Decreased dorsiflexion - right Gait velocity: Decreased Gait velocity interpretation: <1.8  ft/sec, indicate of risk for recurrent falls General Gait Details: Good ability to amb with RW; encouraged WBAT. Pt walking on forefoot due to pain, encouraged DF and WB through heel as tolerated  Stairs Stairs: (Pt declined this session)          Wheelchair Mobility    Modified Rankin (Stroke Patients Only)       Balance Overall balance assessment: Needs assistance   Sitting balance-Leahy Scale: Good       Standing balance-Leahy Scale: Poor Standing balance comment: Reliant on UE support                             Pertinent Vitals/Pain Pain Assessment: Faces Faces Pain Scale: Hurts little more Pain Location: R thigh Pain Descriptors / Indicators: Sore Pain Intervention(s): Premedicated before session;Limited activity within patient's tolerance    Home Living Family/patient expects to be discharged to:: Private residence Living Arrangements: Parent Available Help at Discharge: Family;Available 24 hours/day Type of Home: House Home Access: Stairs to enter Entrance Stairs-Rails: Right Entrance Stairs-Number of Steps: 3 landing steps + 8 steps w/ rail Home Layout: Two level Home Equipment: Crutches Additional Comments: Lives with parents    Prior Function Level of Independence: Independent         Comments: Works at [employer]. Active     Hand Dominance        Extremity/Trunk Assessment   Upper Extremity Assessment Upper Extremity Assessment: Overall WFL for tasks assessed    Lower Extremity Assessment Lower Extremity Assessment: RLE deficits/detail RLE Deficits / Details: s/p R femoral IM nail; hip flex <3/5, knee flex/ext 3/5 RLE: Unable to fully  assess due to pain;Unable to fully assess due to immobilization       Communication   Communication: No difficulties  Cognition Arousal/Alertness: Awake/alert Behavior During Therapy: WFL for tasks assessed/performed Overall Cognitive Status: Within Functional Limits for tasks  assessed                                        General Comments General comments (skin integrity, edema, etc.): Parents present during session. Educ on ice/elevation    Exercises General Exercises - Lower Extremity Long Arc Quad: AROM;Right;5 reps   Assessment/Plan    PT Assessment Patient needs continued PT services  PT Problem List Decreased strength;Decreased range of motion;Decreased activity tolerance;Decreased balance;Decreased mobility;Decreased knowledge of use of DME;Decreased knowledge of precautions       PT Treatment Interventions DME instruction;Gait training;Stair training;Functional mobility training;Therapeutic activities;Therapeutic exercise;Balance training;Patient/family education    PT Goals (Current goals can be found in the Care Plan section)  Acute Rehab PT Goals Patient Stated Goal: Return home and get back to work PT Goal Formulation: With patient Time For Goal Achievement: 05/06/18 Potential to Achieve Goals: Good    Frequency Min 5X/week   Barriers to discharge        Co-evaluation               AM-PAC PT "6 Clicks" Daily Activity  Outcome Measure Difficulty turning over in bed (including adjusting bedclothes, sheets and blankets)?: None Difficulty moving from lying on back to sitting on the side of the bed? : Unable Difficulty sitting down on and standing up from a chair with arms (e.g., wheelchair, bedside commode, etc,.)?: A Little Help needed moving to and from a bed to chair (including a wheelchair)?: A Little Help needed walking in hospital room?: A Little Help needed climbing 3-5 steps with a railing? : A Little 6 Click Score: 17    End of Session Equipment Utilized During Treatment: Gait belt Activity Tolerance: Patient tolerated treatment well;Patient limited by pain Patient left: in chair;with call bell/phone within reach;with family/visitor present Nurse Communication: Mobility status PT Visit Diagnosis:  Other abnormalities of gait and mobility (R26.89);Pain Pain - Right/Left: Right Pain - part of body: Leg    Time: 1016-1050 PT Time Calculation (min) (ACUTE ONLY): 34 min   Charges:   PT Evaluation $PT Eval Low Complexity: 1 Low PT Treatments $Gait Training: 8-22 mins       Ina Homes, PT, DPT Acute Rehabilitation Services  Pager [phone] Office [phone]  Malachy Chamber 04/22/2018, 2:08 PM

## 2018-04-22 NOTE — Anesthesia Postprocedure Evaluation (Signed)
Anesthesia Post Note  Patient: Mason Hawkins  Procedure(s) Performed: INTRAMEDULLARY (IM) NAIL FEMORAL (Right Leg Upper)     Patient location during evaluation: PACU Anesthesia Type: General Level of consciousness: awake and alert Pain management: pain level controlled Vital Signs Assessment: post-procedure vital signs reviewed and stable Respiratory status: spontaneous breathing, nonlabored ventilation, respiratory function stable and patient connected to nasal cannula oxygen Cardiovascular status: blood pressure returned to baseline and stable Postop Assessment: no apparent nausea or vomiting Anesthetic complications: no    Last Vitals:  Vitals:   04/22/18 0000 04/22/18 0023  BP: (!) 133/58 130/75  Pulse: (!) 112 (!) 108  Resp: (!) 24 16  Temp: 36.7 C (!) 38 C  SpO2: 98% 97%    Last Pain:  Vitals:   04/22/18 0023  TempSrc: Oral  PainSc:                  Charlaine Utsey COKER

## 2018-04-22 NOTE — Op Note (Signed)
NAME: Edilia BoCRUMP, Everest O. MEDICAL RECORD ON:[id number] ACCOUNT [id number] DATE OF BIRTH:1995/09/20 FACILITY: [facility] LOCATION: [facility] PHYSICIAN:Chaske Paskett Rosalia Hammers. Ezequiel Macauley, MD  OPERATIVE REPORT  DATE OF PROCEDURE:  04/21/2018  PREOPERATIVE DIAGNOSIS:  Right proximal one-third femoral shaft fracture, closed.  POSTOPERATIVE DIAGNOSIS:  Right proximal one-third femoral shaft fracture, closed.  PROCEDURE PERFORMED:  Open reduction internal fixation of right femur fracture utilizing a Biomet troch entry, VersaNail 9 mm x 340 mm with a proximal and distal interlocks.  SURGEON:  Durene RomansMatthew Star Resler, MD  ASSISTANT:  Surgical team.  ANESTHESIA:  General.  SPECIMENS:  None.  COMPLICATIONS:  None apparent.  ESTIMATED BLOOD LOSS:  Probably 200 mL.  INDICATIONS:  This is a 22 year old male who had a skateboarding injury on 09/16.  He was brought to the emergency room by EMS and found to have a femur fracture.  He was admitted to the hospital with plan for surgery the next day.  Risks, benefits and  necessity of the procedure were reviewed.  The postoperative course and expectations were discussed.  Risks of infection, malunion, nonunion were reviewed.  Consent obtained for benefit of fracture management.  DESCRIPTION OF PROCEDURE:  The patient was brought to the operative theater.  Once adequate anesthesia, preoperative antibiotics, Ancef administered, he was positioned supine on the fracture table.  The left unaffected extremity was flexed and abducted  out of the way with bony prominences padded, particularly over the peroneal nerve laterally.  The right foot was placed into a boot.  Once positioned safely and padded and perineal post placed, traction was applied across the fracture site.  Fluoroscopy  was used to confirm reduction and position of the extremity.  The right lower extremity was then prepped from proximal iliac crest to below the knee.  Shower curtain technique utilized.  A timeout was performed  identifying the patient, the planned  procedure and extremity.  Fluoroscopy was brought back to the field.  Landmarks were identified.  An incision was made laterally to the trochanter.  Soft tissue dissection was carried through the gluteal fascia for insertion of the guidewire.  Based on  his anatomy, I had to use a starting awl to insert into the tip of the trochanter slightly posterior directed anterior.  I then passed a guidewire and confirmed that had passed into the proximal femur in AP and lateral planes.  I then drilled open the  proximal femur.  Based on the reduction due to traction alone, I was able to pass a guidewire without use of a reduction tool from to the distal tibial physeal scar.  I measured and selected a 340 mm nail.  We then began reaming with a 9 mm reamer and  reamed up to 11 mm with good chatter across the isthmus of the femur.  I selected a 9 mm nail.  The right 9 x 340 mm nail was then inserted with hand as well as impaction to its appropriate depth proximally and distally.  Once this was confirmed, the  guidewire was removed.  Fracture site was anatomic.  The proximal interlock was positioned from greater to the lesser trochanter and then under perfect circle technique, a distal interlock was placed.  Final radiographs were obtained in AP and lateral  planes.  The wounds were irrigated with normal saline solution.  The proximal wound was closed in layers with #1 Vicryl and the gluteal fascia, 2-0 Vicryl and staples, the distal wound with staples alone.  The wounds were then cleaned, dried, and dressed  sterilely  with Mepilex dressings.  He was then extubated and brought to the recovery room in stable condition, tolerating the procedure well.  Postoperatively, he will be weightbearing as tolerated with physical therapy assistance.  Findings were  reviewed with family.  TN/NUANCE  D:04/21/2018 T:04/22/2018 JOB:[id number]

## 2018-04-23 ENCOUNTER — Encounter (HOSPITAL_COMMUNITY): Payer: Self-pay

## 2018-04-23 LAB — BASIC METABOLIC PANEL
ANION GAP: 8 (ref 5–15)
BUN: 6 mg/dL (ref 6–20)
CHLORIDE: 102 mmol/L (ref 98–111)
CO2: 25 mmol/L (ref 22–32)
Calcium: 8.1 mg/dL — ABNORMAL LOW (ref 8.9–10.3)
Creatinine, Ser: 0.9 mg/dL (ref 0.61–1.24)
GFR calc non Af Amer: >60 mL/min (ref >60)
GLUCOSE: 101 mg/dL — AB (ref 70–99)
Potassium: 3.6 mmol/L (ref 3.5–5.1)
Sodium: 135 mmol/L (ref 135–145)

## 2018-04-23 LAB — CBC
HCT: 36.4 % — ABNORMAL LOW (ref 39.0–52.0)
HEMOGLOBIN: 12.2 g/dL — AB (ref 13.0–17.0)
MCH: 29.8 pg (ref 26.0–34.0)
MCHC: 33.5 g/dL (ref 30.0–36.0)
MCV: 89 fL (ref 78.0–100.0)
Platelets: 139 10*3/uL — ABNORMAL LOW (ref 150–400)
RBC: 4.09 MIL/uL — ABNORMAL LOW (ref 4.22–5.81)
RDW: 12.4 % (ref 11.5–15.5)
WBC: 9.1 10*3/uL (ref 4.0–10.5)

## 2018-04-23 MED ORDER — POLYETHYLENE GLYCOL 3350 17 G PO PACK: 17.0000 g | PACK | Freq: Every day | ORAL | 0 refills | Status: AC | PRN

## 2018-04-23 MED ORDER — DOCUSATE SODIUM 100 MG PO CAPS: 100.0000 mg | ORAL_CAPSULE | Freq: Two times a day (BID) | ORAL | 0 refills | Status: AC

## 2018-04-23 MED ORDER — ASPIRIN 325 MG PO TBEC: 325.0000 mg | DELAYED_RELEASE_TABLET | Freq: Every day | ORAL | 0 refills | Status: AC

## 2018-04-23 MED ORDER — HYDROCODONE-ACETAMINOPHEN 7.5-325 MG PO TABS: 1.0000 | ORAL_TABLET | ORAL | 0 refills | Status: AC | PRN

## 2018-04-23 MED ORDER — METHOCARBAMOL 500 MG PO TABS: 500.0000 mg | ORAL_TABLET | Freq: Four times a day (QID) | ORAL | 0 refills | Status: AC | PRN

## 2018-04-23 NOTE — Progress Notes (Deleted)
     Subjective: 2 Days Post-Op Procedure(s) (LRB): INTRAMEDULLARY (IM) NAIL FEMORAL (Right)   Patient reports pain as mild, pain controlled. Resting comfortably in bed. No reported events throughout the night. Ready to be discharged home.    Objective:   VITALS:   Vitals:   04/22/18 2022 04/23/18 0500  BP: 135/79 122/70  Pulse: (!) 117 (!) 105  Resp: 19 18  Temp: 99.3 F (37.4 C) 98.9 F (37.2 C)  SpO2: 91% 95%    Dorsiflexion/Plantar flexion intact Incision: dressing C/D/I No cellulitis present Compartment soft  LABS Recent Labs    04/21/18 0434 04/22/18 0520 04/23/18 0327  HGB 14.5 12.9* 12.2*  HCT 43.6 38.5* 36.4*  WBC 10.5 12.8* 9.1  PLT 178 149* 139*    Recent Labs    04/20/18 1850 04/22/18 0520 04/23/18 0327  NA 141 134* 135  K 4.1 3.7 3.6  BUN 10 8 6   CREATININE 0.94 0.93 0.90  GLUCOSE 101* 106* 101*     Assessment/Plan: 2 Days Post-Op Procedure(s) (LRB): INTRAMEDULLARY (IM) NAIL FEMORAL (Right) Up with therapy Discharge home [facility] Follow up with OLIN,Daquan Crapps D in 2 weeks.  Contact information:  [facility]) [address] [place] [id number] [phone]        Anastasio AuerbachMatthew S. Jadah Bobak   PAC  04/23/2018, 8:59 AM

## 2018-04-23 NOTE — Progress Notes (Signed)
Physical Therapy Treatment Patient Details Name: Mason Hawkins MRN: [id number] DOB: Jun 26, 1996 Today's Date: 04/23/2018    History of Present Illness Pt is a 22 y.o. male admitted 04/20/18 after falling off skateboard sustaining R proximal third femur fx; now s/p IM nail. No pertinent PMH on file.    PT Comments    Pt making steady progress with functional mobility, remains limited secondary to pain. Pt tolerated ambulating an increased distance and initiation of stair training. Pt's mother present throughout, very attentive and asked appropriate questions. PT answered all of pt and pt's mother's questions at end of session. Pt would continue to benefit from skilled physical therapy services at this time while admitted and after d/c to address the below listed limitations in order to improve overall safety and independence with functional mobility.    Follow Up Recommendations  Outpatient PT;Supervision for mobility/OOB     Equipment Recommendations  Rolling walker with 5" wheels    Recommendations for Other Services       Precautions / Restrictions Precautions Precautions: Fall Restrictions Weight Bearing Restrictions: Yes RLE Weight Bearing: Weight bearing as tolerated    Mobility  Bed Mobility Overal bed mobility: Needs Assistance Bed Mobility: Supine to Sit     Supine to sit: Min assist     General bed mobility comments: assist with movement of R LE off of bed  Transfers Overall transfer level: Needs assistance Equipment used: Rolling walker (2 wheeled) Transfers: Sit to/from Stand Sit to Stand: Min guard;Supervision         General transfer comment: pt performed x3 from EOB and x1 from BSC, good technique utilized  Ambulation/Gait Ambulation/Gait assistance: Min Emergency planning/management officer (Feet): 50 Feet(50' x3 with stair training and sitting rest break) Assistive device: Rolling walker (2 wheeled) Gait Pattern/deviations: Step-to pattern;Decreased  weight shift to right;Decreased dorsiflexion - right Gait velocity: Decreased Gait velocity interpretation: <1.31 ft/sec, indicative of household ambulator General Gait Details: pt steady with RW with good technique utilized; encouraged WB'ing through R LE to tolerance   Stairs Stairs: Yes Stairs assistance: Min guard Stair Management: One rail Right;Step to pattern;Sideways Number of Stairs: 2 General stair comments: increased time and effort needed, cueing for sequencing and technique, min guard for safety; pt very limited secondary to pain   Wheelchair Mobility    Modified Rankin (Stroke Patients Only)       Balance Overall balance assessment: Needs assistance Sitting-balance support: Feet supported Sitting balance-Leahy Scale: Good     Standing balance support: During functional activity;Bilateral upper extremity supported Standing balance-Leahy Scale: Poor Standing balance comment: Reliant on UE support                            Cognition Arousal/Alertness: Awake/alert Behavior During Therapy: WFL for tasks assessed/performed Overall Cognitive Status: Within Functional Limits for tasks assessed                                        Exercises      General Comments        Pertinent Vitals/Pain Pain Assessment: 0-10 Pain Score: 8  Pain Location: R thigh Pain Descriptors / Indicators: Sore Pain Intervention(s): Monitored during session;Repositioned;Premedicated before session;Patient requesting pain meds-RN notified    Home Living  Prior Function            PT Goals (current goals can now be found in the care plan section) Acute Rehab PT Goals PT Goal Formulation: With patient Time For Goal Achievement: 05/06/18 Potential to Achieve Goals: Good Progress towards PT goals: Progressing toward goals    Frequency    Min 5X/week      PT Plan Current plan remains appropriate     Co-evaluation              AM-PAC PT "6 Clicks" Daily Activity  Outcome Measure  Difficulty turning over in bed (including adjusting bedclothes, sheets and blankets)?: A Little Difficulty moving from lying on back to sitting on the side of the bed? : Unable Difficulty sitting down on and standing up from a chair with arms (e.g., wheelchair, bedside commode, etc,.)?: Unable Help needed moving to and from a bed to chair (including a wheelchair)?: A Little Help needed walking in hospital room?: A Little Help needed climbing 3-5 steps with a railing? : A Little 6 Click Score: 14    End of Session Equipment Utilized During Treatment: Gait belt Activity Tolerance: Patient limited by pain Patient left: in chair;with call bell/phone within reach;with family/visitor present Nurse Communication: Mobility status;Patient requests pain meds PT Visit Diagnosis: Other abnormalities of gait and mobility (R26.89);Pain Pain - Right/Left: Right Pain - part of body: Leg     Time: 5284-13240924-1017 PT Time Calculation (min) (ACUTE ONLY): 53 min  Charges:  $Gait Training: 23-37 mins $Therapeutic Activity: 23-37 mins                     Deborah ChalkJennifer Jerrad Mendibles, PT, DPT  Acute Rehabilitation Services Pager [phone] Office [phone]     Alessandra BevelsJennifer M Khalise Billard 04/23/2018, 10:50 AM

## 2018-04-23 NOTE — Progress Notes (Signed)
Discharge instructions given to pt and his mother. Verbalized understanding. Right hip dressing dry and intact. Pt was ambulating with the walker, standby assist. Discharged to home accompanied by his parents.

## 2018-04-23 NOTE — Care Management Note (Signed)
Case Management Note  Patient Details  Name: Mason Hawkins MRN: [id number] Date of Birth: September 05, 1995  Subjective/Objective:   Sustaining R proximal third femur fx, s/p IM Nail                 Action/Plan: NCM spoke to pt. Pt's mother will be there to assist pt. Contacted [facility] for RW and 3n1 bedside commode for home. Contacted Ortho tech for crutches for home.   Expected Discharge Date:  04/23/18               Expected Discharge Plan:  Home/Self Care  In-House Referral:  NA  Discharge planning Services  CM Consult  Post Acute Care Choice:  NA Choice offered to:  NA  DME Arranged:  3-N-1, Walker rolling DME Agency:  [facility]  HH Arranged:  NA HH Agency:  NA  Status of Service:  Completed, signed off  If discussed at Long Length of Stay Meetings, dates discussed:    Additional Comments:  [name], [name], RN 04/23/2018, 12:13 PM

## 2018-04-23 NOTE — Progress Notes (Signed)
Orthopedic Tech Progress Note Patient Details:  Mason Hawkins 1995/09/21 [id number]  Ortho Devices Type of Ortho Device: Crutches Ortho Device/Splint Interventions: Application   Post Interventions Patient Tolerated: Well Instructions Provided: Care of device   Mason Hawkins, Mason Hawkins 04/23/2018, 11:02 AM

## 2018-04-23 NOTE — Progress Notes (Addendum)
     Subjective: 2 Days Post-Op Procedure(s) (LRB): INTRAMEDULLARY (IM) NAIL FEMORAL (Right)   Patient reports pain as mild, pain controlled. No events throughout the night. Feels that he is doing well and ready to be discharged home.    Objective:   VITALS:   Vitals:   04/22/18 2022 04/23/18 0500  BP: 135/79 122/70  Pulse: (!) 117 (!) 105  Resp: 19 18  Temp: 99.3 F (37.4 C) 98.9 F (37.2 C)  SpO2: 91% 95%    Dorsiflexion/Plantar flexion intact Incision: dressing C/D/I No cellulitis present Compartment soft  LABS Recent Labs    04/21/18 0434 04/22/18 0520 04/23/18 0327  HGB 14.5 12.9* 12.2*  HCT 43.6 38.5* 36.4*  WBC 10.5 12.8* 9.1  PLT 178 149* 139*    Recent Labs    04/20/18 1850 04/22/18 0520 04/23/18 0327  NA 141 134* 135  K 4.1 3.7 3.6  BUN 10 8 6   CREATININE 0.94 0.93 0.90  GLUCOSE 101* 106* 101*     Assessment/Plan: 2 Days Post-Op Procedure(s) (LRB): INTRAMEDULLARY (IM) NAIL FEMORAL (Right) Up with therapy Discharge home [facility] Follow up with OLIN,Gracelynn Bircher D in 2 weeks.  Contact information:  [facility]) [address] [place] [id number] [phone]        Anastasio AuerbachMatthew S. Quintavia Rogstad   PAC  04/23/2018, 8:58 AM

## 2018-04-28 NOTE — Discharge Summary (Signed)
Physician Discharge Summary  Patient ID: Mason Hawkins MRN: [id number] DOB/AGE: 1996/02/21 22 y.o.  Admit date: 04/20/2018 Discharge date: 04/23/2018   Procedures:  Procedure(s) (LRB): INTRAMEDULLARY (IM) NAIL FEMORAL (Right)  Attending Physician:  Dr. Durene Romans   Admission Diagnoses:   Right femur fracture   Discharge Diagnoses:  Active Problems:   Femur fracture, right (HCC)   Closed fracture of right femur, unspecified fracture morphology, initial encounter (HCC)  History reviewed. No pertinent past medical history.  HPI:    Pt is a 22 y.o. male complaining of right thigh pain after falling while skating.  Immediate onset of pain, inability to bear weight and brought to ER by EMS.  ER evaluation revealed right proximal third femur fracture.  PCP: Madelin Headings, MD   Discharged Condition: good  Hospital Course:  Patient was admitted to the hospital on 04/21/2018.  Patient had an uneventful course and underwent the above stated procedure on 04/21/2018. Patient tolerated the procedure well and brought to the recovery room in good condition and subsequently to the floor.  POD #1 BP: 132/72 ; Pulse: 104 ; Temp: 99.6 F (37.6 C) ; Resp: 16 Patient reports pain as mild, states that he pain is really well controlled. Resting in bed comfortably.  No reported events throughout the night.  Dorsiflexion/plantar flexion intact, incision: dressing C/D/I, no cellulitis present and compartment soft.   LABS  Basename    HGB     12.9  HCT     38.5   POD #2  BP: 122/70 ; Pulse: 105 ; Temp: 98.9 F (37.2 C) ; Resp: 18 Patient reports pain as mild, pain controlled. No events throughout the night. Feels that he is doing well and ready to be discharged home.   LABS  Basename    HGB     12.2  HCT     36.4    Discharge Exam: General appearance: alert, cooperative and no distress Extremities: Homans sign is negative, no sign of DVT, no edema, redness or tenderness in the calves or  thighs and no ulcers, gangrene or trophic changes  Disposition:  Home with follow up in 2 weeks   Follow-up Information    Durene Romans, MD. Schedule an appointment as soon as possible for a visit in 2 week(s).   Specialty:  Orthopedic Surgery Contact information: [address] [place] [address] [phone]        [facility] - Dme Follow up.   Why:  will deliver Rolling Walker and 3n1 bedside commode to room prior to discharge Contact information: [address] [place] [address] [phone]           Discharge Instructions    Call MD / Call 911   Complete by:  As directed    If you experience chest pain or shortness of breath, CALL 911 and be transported to the hospital emergency room.  If you develope a fever above 101 F, pus (white drainage) or increased drainage or redness at the wound, or calf pain, call your surgeon's office.   Constipation Prevention   Complete by:  As directed    Drink plenty of fluids.  Prune juice may be helpful.  You may use a stool softener, such as Colace (over the counter) 100 mg twice a day.  Use MiraLax (over the counter) for constipation as needed.   Diet - low sodium heart healthy   Complete by:  As directed  Discharge instructions   Complete by:  As directed    Maintain surgical dressing for 2-3 days.  After do daily dressing changes with gauze and tape, must keep the areas dry and clean.  Follow up in 2 weeks at [facility]. Call with any questions or concerns.   Increase activity slowly as tolerated   Complete by:  As directed    Weight bearing as tolerated with assist device (walker, cane, etc) as directed, use it as long as suggested by your surgeon or therapist, typically at least 4-6 weeks.      Allergies as of 04/23/2018      Reactions   Peanut-containing Drug Products Other (See Comments)   Scratchy throat      Medication List    TAKE these medications     aspirin 325 MG EC tablet Take 1 tablet (325 mg total) by mouth daily with breakfast.   docusate sodium 100 MG capsule Commonly known as:  COLACE Take 1 capsule (100 mg total) by mouth 2 (two) times daily.   HYDROcodone-acetaminophen 7.5-325 MG tablet Commonly known as:  NORCO Take 1-2 tablets by mouth every 4 (four) hours as needed for moderate pain.   methocarbamol 500 MG tablet Commonly known as:  ROBAXIN Take 1 tablet (500 mg total) by mouth every 6 (six) hours as needed for muscle spasms.   polyethylene glycol packet Commonly known as:  MIRALAX / GLYCOLAX Take 17 g by mouth daily as needed for mild constipation or moderate constipation.        Signed: Anastasio AuerbachMatthew S. Mykell Genao   PA-C  04/28/2018, 10:04 AM

## 2019-06-20 ENCOUNTER — Other Ambulatory Visit: Payer: Self-pay | Admitting: Cardiology

## 2019-06-20 DIAGNOSIS — Z20822 Contact with and (suspected) exposure to covid-19: Secondary | ICD-10-CM

## 2019-06-21 LAB — NOVEL CORONAVIRUS, NAA: SARS-CoV-2, NAA: DETECTED — AB

## 2019-06-23 ENCOUNTER — Telehealth: Payer: Self-pay | Admitting: Critical Care Medicine

## 2019-06-23 NOTE — Telephone Encounter (Signed)
This is a 23 year old male who was positive for Covid on November 15 testing event The patient has loss of taste and smell and did have low-grade fever initially but this is resolved.  His symptoms began on 13 November.  He notes that his mother is in the ICU with Covid 19 infection and is on the ventilator machine.  His father is doing well and is Covid negative.  Patient knows the health department may be in touch.  I gave him isolation time to include being out of isolation by November 24 provided he has no fever the 3 days prior to that.  Also gave him advice as to supplements he could take to shorten the course of the virus.  I asked him to go to the emergency room if he gets dramatically worse with the shortness of breath and fever is spiking  The patient states he does not have a primary care provider at this time

## 2019-06-29 ENCOUNTER — Other Ambulatory Visit: Payer: Self-pay | Admitting: Cardiology

## 2019-06-29 DIAGNOSIS — Z20822 Contact with and (suspected) exposure to covid-19: Secondary | ICD-10-CM

## 2019-07-01 LAB — NOVEL CORONAVIRUS, NAA: SARS-CoV-2, NAA: NOT DETECTED

## 2020-05-25 ENCOUNTER — Other Ambulatory Visit: Payer: Self-pay

## 2020-05-25 ENCOUNTER — Ambulatory Visit (HOSPITAL_COMMUNITY)
Admission: EM | Admit: 2020-05-25 | Discharge: 2020-05-25 | Disposition: A | Discharge status: Home / Self Care | Payer: BC Managed Care – PPO | Attending: Family Medicine | Admitting: Family Medicine

## 2020-05-25 ENCOUNTER — Encounter (HOSPITAL_COMMUNITY): Payer: Self-pay

## 2020-05-25 DIAGNOSIS — R590 Localized enlarged lymph nodes: Secondary | ICD-10-CM | POA: Diagnosis not present

## 2020-05-25 NOTE — Discharge Instructions (Signed)
Please try heat on the area  You may need an ultrasound or blood work  It may be a lymph node or a lipoma.  Please follow up if your symptoms fail to improve.

## 2020-05-25 NOTE — ED Triage Notes (Signed)
Pt present cyst on the back of his head. Pt states this is a recurrent issue. It hurts when he moves his head and today its very sore.

## 2020-05-25 NOTE — ED Provider Notes (Signed)
[facility]    CSN: [id number] Arrival date & time: 05/25/20  1752      History   Chief Complaint Chief Complaint  Patient presents with  . Cyst    on back of head    HPI Deanthony Maull. is a 24 y.o. male.   He is presenting with a lump in the back of his scalp and upper neck.  Has been there for about a year.  He does have some soreness over the area.  Denies any drainage.  No history of trauma.  It is intermittent in nature.  HPI  History reviewed. No pertinent past medical history.  Patient Active Problem List   Diagnosis Date Noted  . Femur fracture, right (HCC) 04/20/2018  . Closed fracture of right femur, unspecified fracture morphology, initial encounter (HCC) 04/20/2018  . INFECTION, TOE 10/07/2007    Past Surgical History:  Procedure Laterality Date  . FEMUR IM NAIL Right 04/21/2018   Procedure: INTRAMEDULLARY (IM) NAIL FEMORAL;  Surgeon: Durene Romans, MD;  Location: [facility] OR;  Service: Orthopedics;  Laterality: Right;       Home Medications    Prior to Admission medications   Medication Sig Start Date End Date Taking? Authorizing Provider  docusate sodium (COLACE) 100 MG capsule Take 1 capsule (100 mg total) by mouth 2 (two) times daily. 04/23/18   Lanney Gins, PA-C  HYDROcodone-acetaminophen (NORCO) 7.5-325 MG tablet Take 1-2 tablets by mouth every 4 (four) hours as needed for moderate pain. 04/23/18   Lanney Gins, PA-C  methocarbamol (ROBAXIN) 500 MG tablet Take 1 tablet (500 mg total) by mouth every 6 (six) hours as needed for muscle spasms. 04/23/18   Lanney Gins, PA-C  polyethylene glycol (MIRALAX / GLYCOLAX) packet Take 17 g by mouth daily as needed for mild constipation or moderate constipation. 04/23/18   Lanney Gins, PA-C    Family History History reviewed. No pertinent family history.  Social History Social History   Tobacco Use  . Smoking status: Never Smoker  . Smokeless tobacco: Never Used  Substance Use Topics   . Alcohol use: Never  . Drug use: Never     Allergies   Peanut-containing drug products   Review of Systems Review of Systems  See HPI  Physical Exam Triage Vital Signs ED Triage Vitals  Enc Vitals Group     BP 05/25/20 1838 120/73     Pulse Rate 05/25/20 1838 76     Resp 05/25/20 1838 16     Temp 05/25/20 1838 98.6 F (37 C)     Temp Source 05/25/20 1838 Oral     SpO2 05/25/20 1838 99 %     Weight --      Height --      Head Circumference --      Peak Flow --      Pain Score 05/25/20 1839 5     Pain Loc --      Pain Edu? --      Excl. in GC? --    No data found.  Updated Vital Signs BP 120/73 (BP Location: Right Arm)   Pulse 76   Temp 98.6 F (37 C) (Oral)   Resp 16   SpO2 99%   Visual Acuity Right Eye Distance:   Left Eye Distance:   Bilateral Distance:    Right Eye Near:   Left Eye Near:    Bilateral Near:     Physical Exam Gen: NAD, alert, cooperative with exam, well-appearing  ENT: normal lips, normal nasal mucosa, mobile lymph node in the posterior neck. Eye: normal EOM, normal conjunctiva and lids  Skin: no rashes, no areas of induration  Neuro: normal tone, normal sensation to touch Psych:  normal insight, alert and oriented    UC Treatments / Results  Labs (all labs ordered are listed, but only abnormal results are displayed) Labs Reviewed - No data to display  EKG   Radiology No results found.  Procedures Procedures (including critical care time)  Medications Ordered in UC Medications - No data to display  Initial Impression / Assessment and Plan / UC Course  I have reviewed the triage vital signs and the nursing notes.  Pertinent labs & imaging results that were available during my care of the patient were reviewed by me and considered in my medical decision making (see chart for details).     Mr. Esquivias is a 24 year old male that is presenting with symptoms suggestive of a solitary cervical lymph node.  Has been having  this for about a year.  It fluctuates in size.  Causes pain intermittently.  No fevers or chills.  Counseled supportive care.  Given indications on follow-up.  Final Clinical Impressions(s) / UC Diagnoses   Final diagnoses:  Cervical lymphadenopathy     Discharge Instructions     Please try heat on the area  You may need an ultrasound or blood work  It may be a lymph node or a lipoma.  Please follow up if your symptoms fail to improve.     ED Prescriptions    None     PDMP not reviewed this encounter.   Myra Rude, MD 05/25/20 [phone]

## 2020-05-30 ENCOUNTER — Other Ambulatory Visit: Payer: Self-pay | Admitting: Adult Health Nurse Practitioner

## 2020-05-30 DIAGNOSIS — R599 Enlarged lymph nodes, unspecified: Secondary | ICD-10-CM

## 2020-06-01 ENCOUNTER — Ambulatory Visit
Admission: RE | Admit: 2020-06-01 | Discharge: 2020-06-01 | Disposition: A | Discharge status: Home / Self Care | Payer: BC Managed Care – PPO | Source: Ambulatory Visit | Attending: Adult Health Nurse Practitioner | Admitting: Adult Health Nurse Practitioner

## 2020-06-01 DIAGNOSIS — R599 Enlarged lymph nodes, unspecified: Secondary | ICD-10-CM

## 2021-01-19 ENCOUNTER — Encounter (HOSPITAL_COMMUNITY): Payer: Self-pay

## 2021-01-19 ENCOUNTER — Ambulatory Visit (INDEPENDENT_AMBULATORY_CARE_PROVIDER_SITE_OTHER): Payer: BC Managed Care – PPO

## 2021-01-19 ENCOUNTER — Ambulatory Visit (HOSPITAL_COMMUNITY)
Admission: EM | Admit: 2021-01-19 | Discharge: 2021-01-19 | Disposition: A | Discharge status: Home / Self Care | Payer: BC Managed Care – PPO | Attending: Emergency Medicine | Admitting: Emergency Medicine

## 2021-01-19 ENCOUNTER — Other Ambulatory Visit: Payer: Self-pay

## 2021-01-19 DIAGNOSIS — M25571 Pain in right ankle and joints of right foot: Secondary | ICD-10-CM | POA: Diagnosis not present

## 2021-01-19 DIAGNOSIS — S93401A Sprain of unspecified ligament of right ankle, initial encounter: Secondary | ICD-10-CM | POA: Diagnosis not present

## 2021-01-19 MED ORDER — MELOXICAM 15 MG PO TABS: 15.0000 mg | ORAL_TABLET | Freq: Every day | ORAL | 0 refills | Status: AC

## 2021-01-19 NOTE — ED Provider Notes (Signed)
[facility]  ____________________________________________  Time seen: Approximately 9:39 AM  I have reviewed the triage vital signs and the nursing notes.   HISTORY  Chief Complaint Ankle Pain and Foot Pain   Historian Patient     HPI Mason Errico. is a 25 y.o. male with a history of prior right femur fracture, presents to the emergency department with a right lateral ankle and dorsal foot pain.  Patient accidentally put his foot through a wooden stair yesterday and sustained an inversion type ankle injury.  Patient has had difficulty bearing weight since injury occurred.  No abrasions or lacerations.  No prior right ankle sprains in the past.  No other alleviating measures have been attempted.   History reviewed. No pertinent past medical history.   Immunizations up to date:  Yes.     History reviewed. No pertinent past medical history.  Patient Active Problem List   Diagnosis Date Noted   Femur fracture, right (HCC) 04/20/2018   Closed fracture of right femur, unspecified fracture morphology, initial encounter (HCC) 04/20/2018   INFECTION, TOE 10/07/2007    Past Surgical History:  Procedure Laterality Date   FEMUR IM NAIL Right 04/21/2018   Procedure: INTRAMEDULLARY (IM) NAIL FEMORAL;  Surgeon: Durene Romans, MD;  Location: [facility] OR;  Service: Orthopedics;  Laterality: Right;    Prior to Admission medications   Medication Sig Start Date End Date Taking? Authorizing Provider  meloxicam (MOBIC) 15 MG tablet Take 1 tablet (15 mg total) by mouth daily for 7 days. 01/19/21 01/26/21 Yes Pia Mau M, PA-C  docusate sodium (COLACE) 100 MG capsule Take 1 capsule (100 mg total) by mouth 2 (two) times daily. 04/23/18   Lanney Gins, PA-C  HYDROcodone-acetaminophen (NORCO) 7.5-325 MG tablet Take 1-2 tablets by mouth every 4 (four) hours as needed for moderate pain. 04/23/18   Lanney Gins, PA-C  methocarbamol (ROBAXIN) 500 MG tablet Take 1 tablet (500 mg  total) by mouth every 6 (six) hours as needed for muscle spasms. 04/23/18   Lanney Gins, PA-C  polyethylene glycol (MIRALAX / GLYCOLAX) packet Take 17 g by mouth daily as needed for mild constipation or moderate constipation. 04/23/18   Lanney Gins, PA-C    Allergies Peanut-containing drug products  History reviewed. No pertinent family history.  Social History Social History   Tobacco Use   Smoking status: Never   Smokeless tobacco: Never  Substance Use Topics   Alcohol use: Never   Drug use: Never     Review of Systems  Constitutional: No fever/chills Eyes:  No discharge ENT: No upper respiratory complaints. Respiratory: no cough. No SOB/ use of accessory muscles to breath Gastrointestinal:   No nausea, no vomiting.  No diarrhea.  No constipation. Musculoskeletal: Patient has right foot and ankle pain.  Skin: Negative for rash, abrasions, lacerations, ecchymosis.    ____________________________________________   PHYSICAL EXAM:  VITAL SIGNS: ED Triage Vitals  Enc Vitals Group     BP 01/19/21 0912 (!) 141/91     Pulse Rate 01/19/21 0912 84     Resp 01/19/21 0912 18     Temp 01/19/21 0921 98.8 F (37.1 C)     Temp Source 01/19/21 0912 Oral     SpO2 01/19/21 0912 95 %     Weight --      Height --      Head Circumference --      Peak Flow --      Pain Score 01/19/21 0910 10  Pain Loc --      Pain Edu? --      Excl. in GC? --      Constitutional: Alert and oriented. Well appearing and in no acute distress. Eyes: Conjunctivae are normal. PERRL. EOMI. Head: Atraumatic. ENT:  Cardiovascular: Normal rate, regular rhythm. Normal S1 and S2.  Good peripheral circulation. Respiratory: Normal respiratory effort without tachypnea or retractions. Lungs CTAB. Good air entry to the bases with no decreased or absent breath sounds Gastrointestinal: Bowel sounds x 4 quadrants. Soft and nontender to palpation. No guarding or rigidity. No  distention. Musculoskeletal: Patient has tenderness to palpation over anterior and posterior talofibular ligaments.  No pain to palpation over the deltoid ligament.  Palpable dorsalis pedis pulse bilaterally and symmetrically.  Capillary refill less than 2 seconds on the right. Neurologic:  Normal for age. No gross focal neurologic deficits are appreciated.  Skin:  Skin is warm, dry and intact. No rash noted. Psychiatric: Mood and affect are normal for age. Speech and behavior are normal.   ____________________________________________   LABS (all labs ordered are listed, but only abnormal results are displayed)  Labs Reviewed - No data to display ____________________________________________  EKG   ____________________________________________  RADIOLOGY Geraldo Pitter, personally viewed and evaluated these images (plain radiographs) as part of my medical decision making, as well as reviewing the written report by the radiologist.   DG Ankle Complete Right  Result Date: 01/19/2021 CLINICAL DATA:  Pain following rolling injury EXAM: RIGHT ANKLE - COMPLETE 3+ VIEW COMPARISON:  None. FINDINGS: Frontal, oblique, and lateral views were obtained. There is no fracture or joint effusion. Joint spaces appear normal. No erosive change. Ankle mortise appears intact. IMPRESSION: No fracture or appreciable arthropathy. Ankle mortise appears intact. Electronically Signed   By: Bretta Bang III M.D.   On: 01/19/2021 09:54   DG Foot Complete Right  Result Date: 01/19/2021 CLINICAL DATA:  Pain following rolling type injury EXAM: RIGHT FOOT COMPLETE - 3+ VIEW COMPARISON:  None. FINDINGS: Frontal, oblique, and lateral views were obtained. There is no fracture or dislocation. Joint spaces appear normal. No erosive change. IMPRESSION: No fracture or dislocation.  No evident arthropathy. Electronically Signed   By: Bretta Bang III M.D.   On: 01/19/2021 09:53     ____________________________________________    PROCEDURES  Procedure(s) performed:     Procedures     Medications - No data to display   ____________________________________________   INITIAL IMPRESSION / ASSESSMENT AND PLAN / ED COURSE  Pertinent labs & imaging results that were available during my care of the patient were reviewed by me and considered in my medical decision making (see chart for details).      Assessment and Plan: Right foot pain:  Right ankle pain:  26 year old male presents to the urgent care with right foot and right ankle pain after he accidentally stepped his foot through a wooden step.  Vital signs are reassuring at triage.  On physical exam, patient was alert, active and nontoxic-appearing.  X-rays of the right foot and right ankle were obtained in the urgent care  No bony abnormalities were visualized on x-rays.  A lace up ankle brace was given in the urgent care and patient was started on meloxicam.  A work note was provided.  All patient questions were answered.   ____________________________________________  FINAL CLINICAL IMPRESSION(S) / ED DIAGNOSES  Final diagnoses:  Sprain of right ankle, unspecified ligament, initial encounter      NEW MEDICATIONS STARTED  DURING THIS VISIT:  ED Discharge Orders          Ordered    meloxicam (MOBIC) 15 MG tablet  Daily        01/19/21 1005                This chart was dictated using voice recognition software/Dragon. Despite best efforts to proofread, errors can occur which can change the meaning. Any change was purely unintentional.     Orvil Feil, PA-C 01/19/21 1008

## 2021-01-19 NOTE — Discharge Instructions (Addendum)
Take meloxicam once daily for pain and inflammation. Apply ice and elevate nightly. Please follow-up with podiatry as needed.

## 2021-01-19 NOTE — ED Triage Notes (Signed)
Pt in with c/o right ankle/foot that happened yesterday when his foot went through his wooden stairs and twisted   Pt states he noticed bruising and swelling on the top of his foot and ankle

## 2023-02-16 IMAGING — DX DG ANKLE COMPLETE 3+V*R*
3 series · 3 of 3 positions shown · non-contrast
Comparison: None.

CLINICAL DATA: Pain following rolling injury

EXAM:
RIGHT ANKLE - COMPLETE 3+ VIEW

[ankle ap]
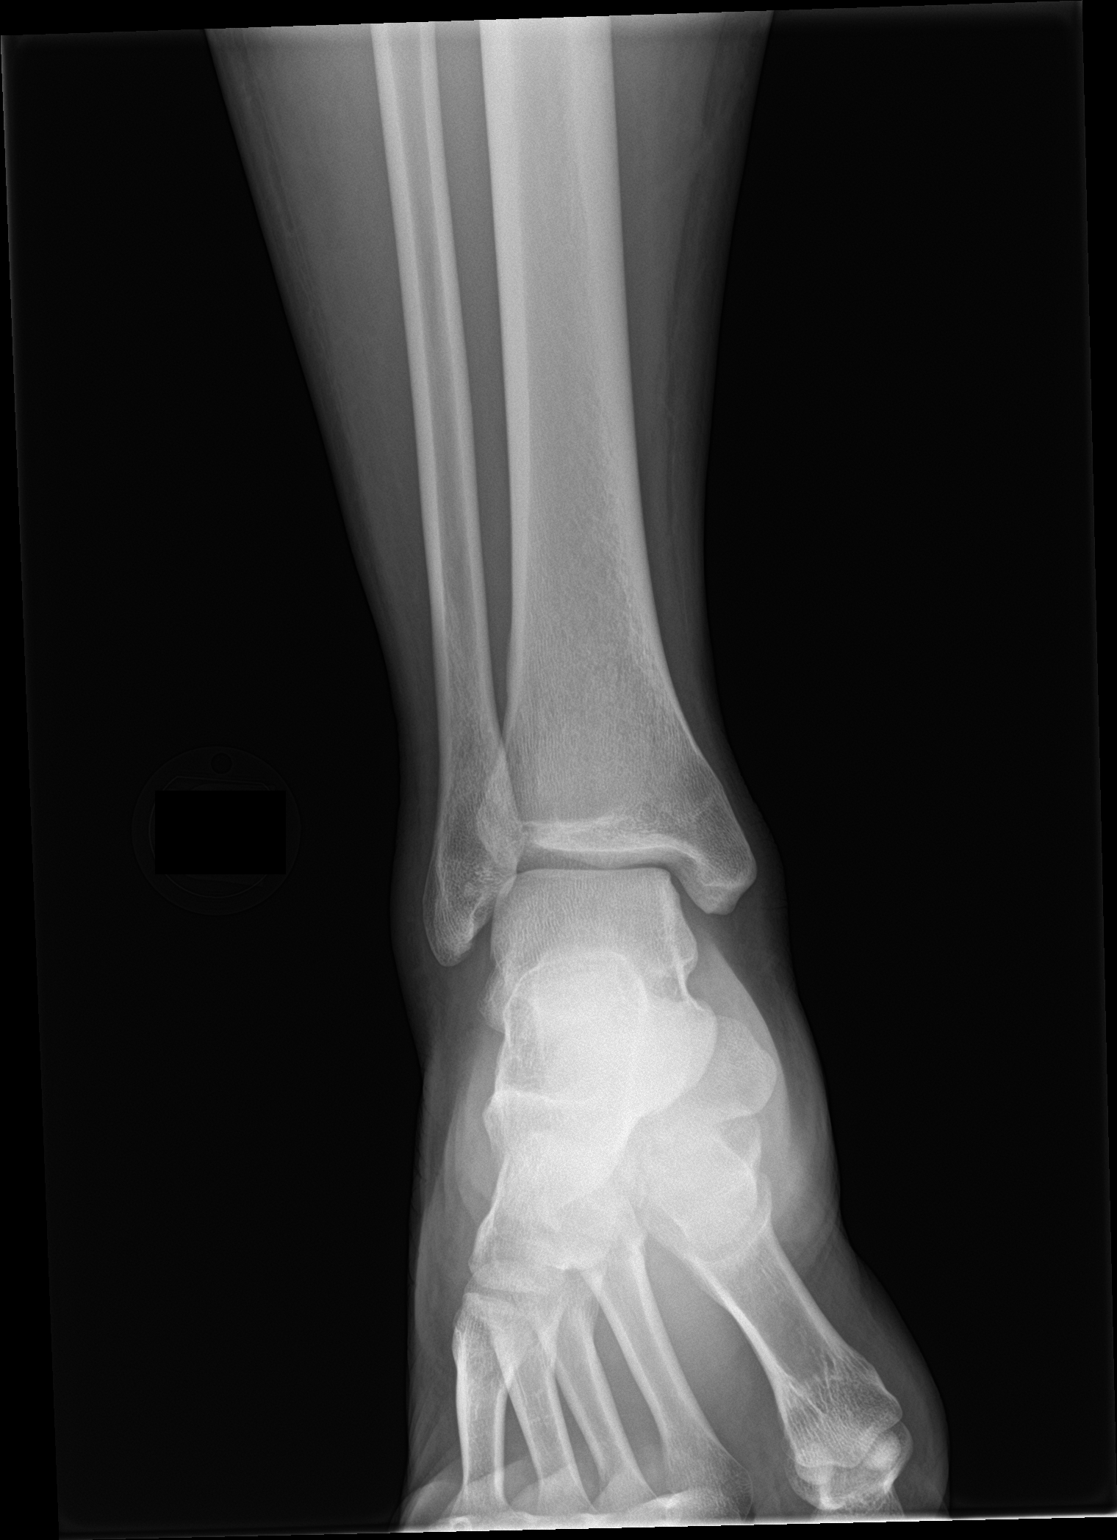

[ankle obl]
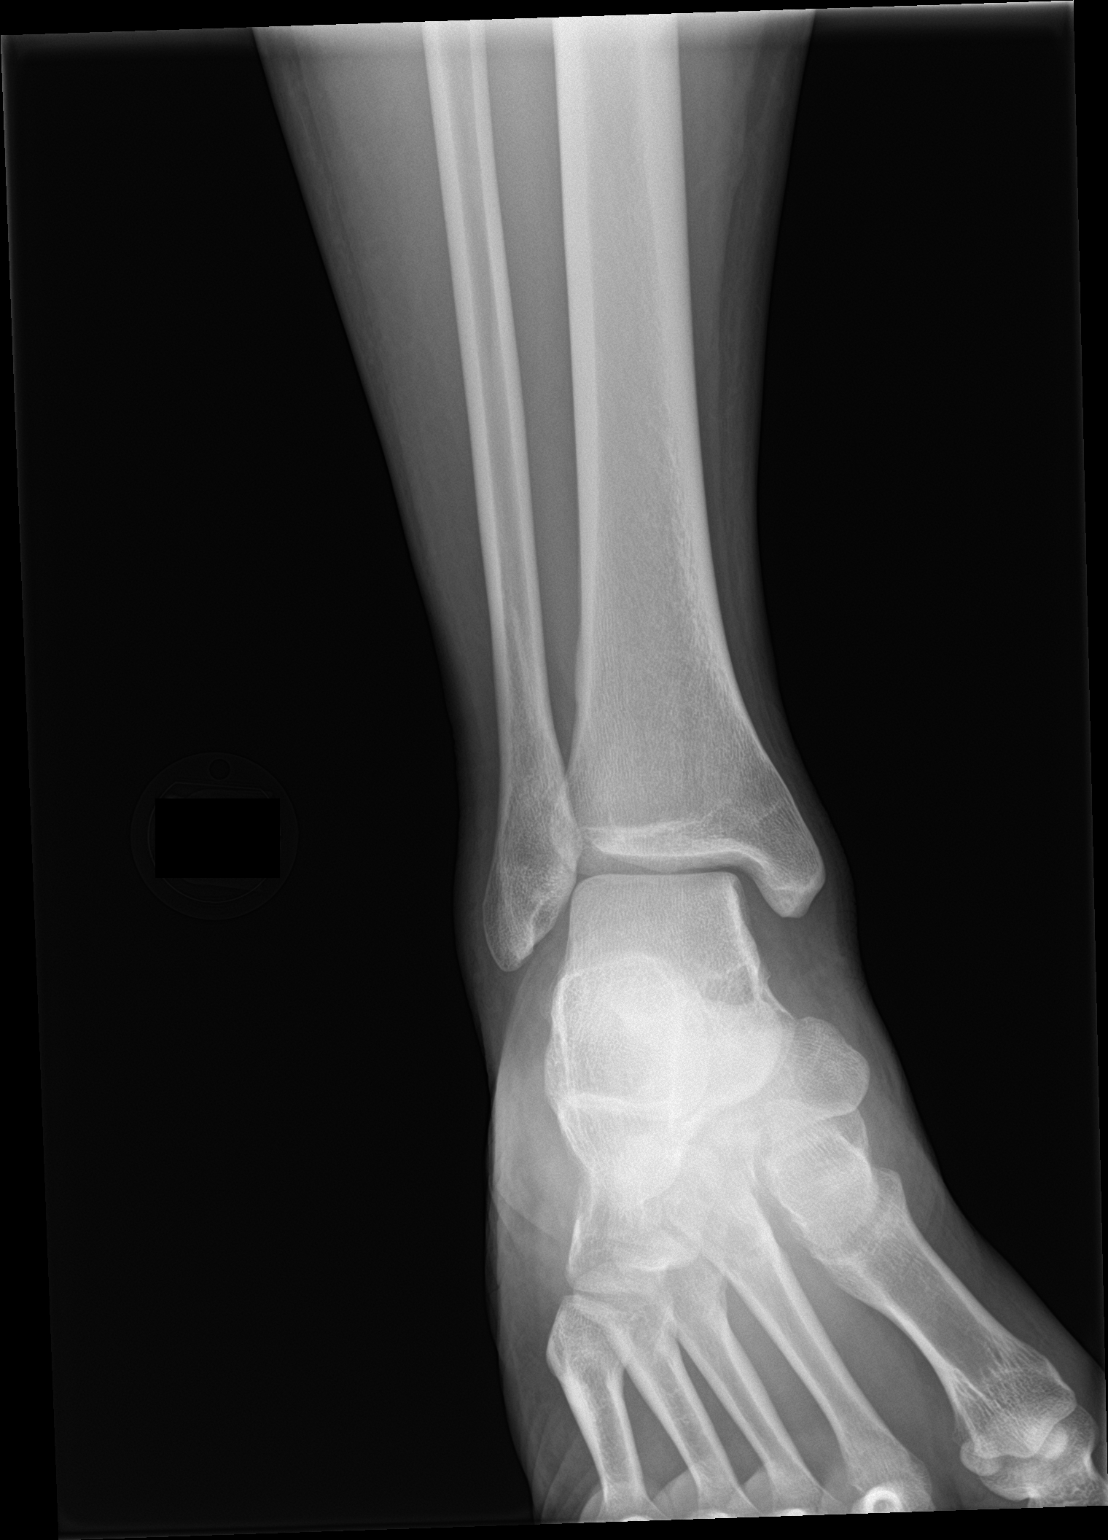

[ankle lat]
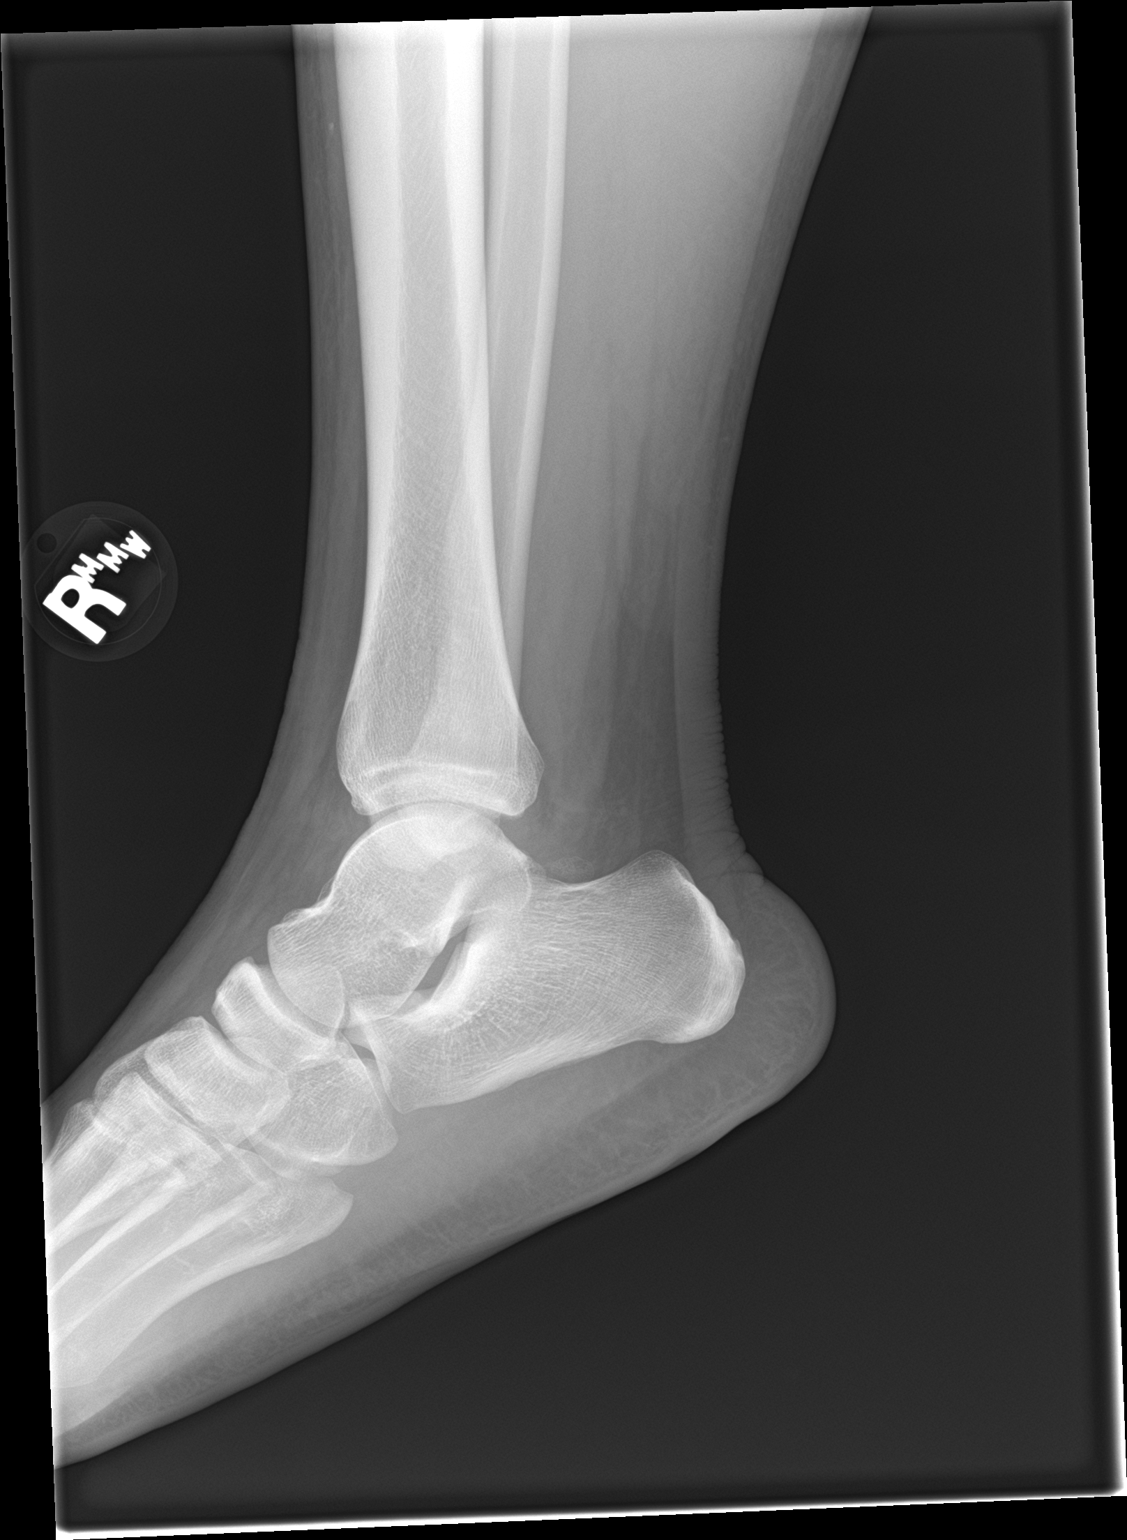

[3 of 3 positions shown; findings below may reference images not displayed]

FINDINGS: Frontal, oblique, and lateral views were obtained. There is no
fracture or joint effusion. Joint spaces appear normal. No erosive
change. Ankle mortise appears intact.
IMPRESSION: No fracture or appreciable arthropathy. Ankle mortise appears
intact.

## 2023-02-16 IMAGING — DX DG FOOT COMPLETE 3+V*R*
3 series · 3 of 3 positions shown · non-contrast
Comparison: None.

CLINICAL DATA: Pain following rolling type injury

EXAM:
RIGHT FOOT COMPLETE - 3+ VIEW

[foot ap]
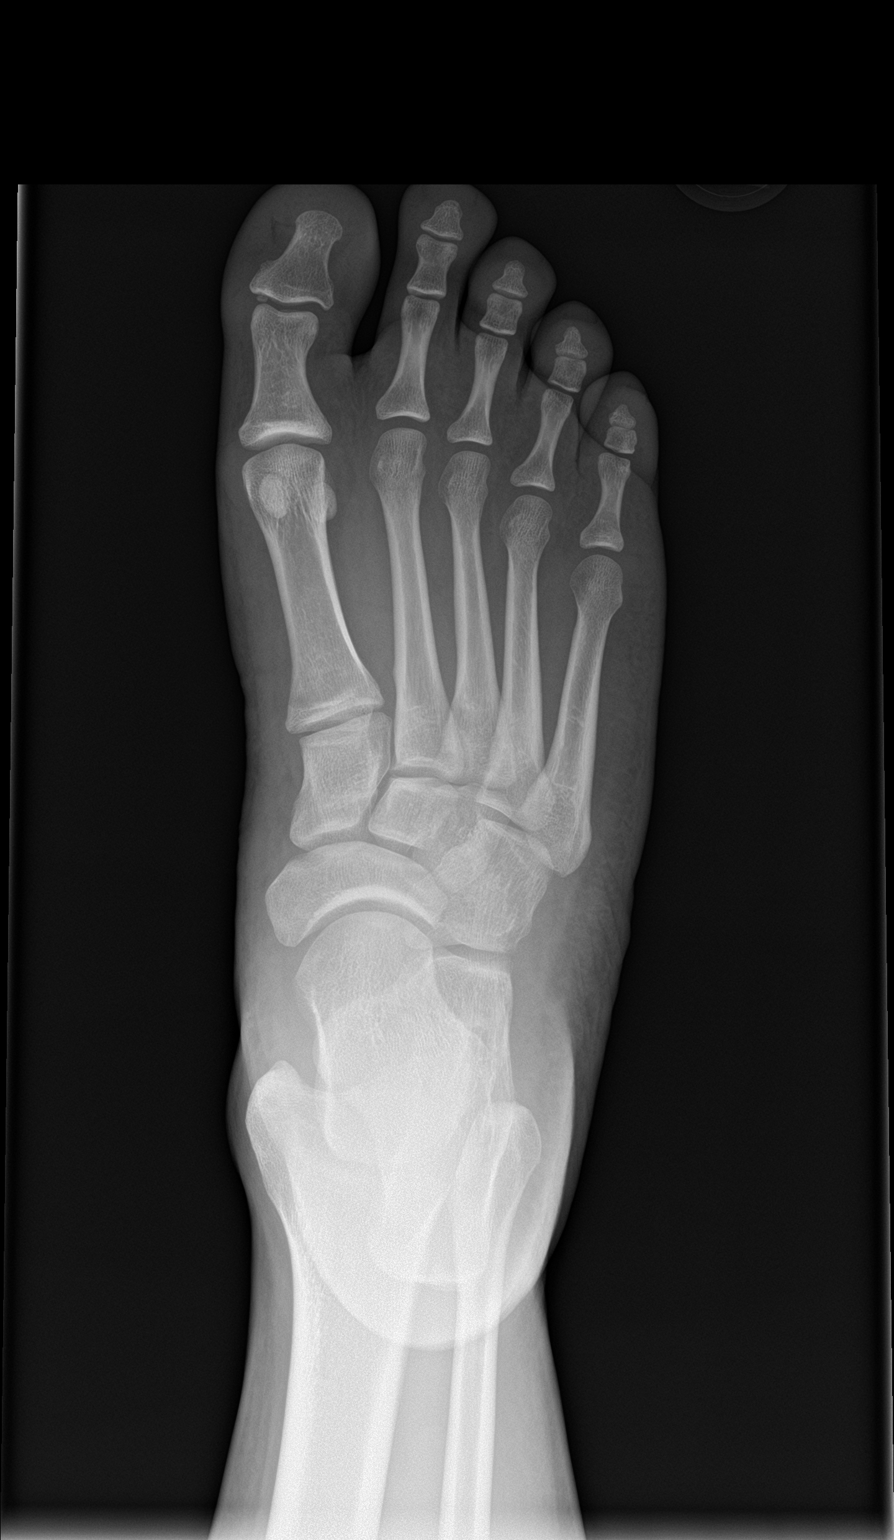

[foot obl]
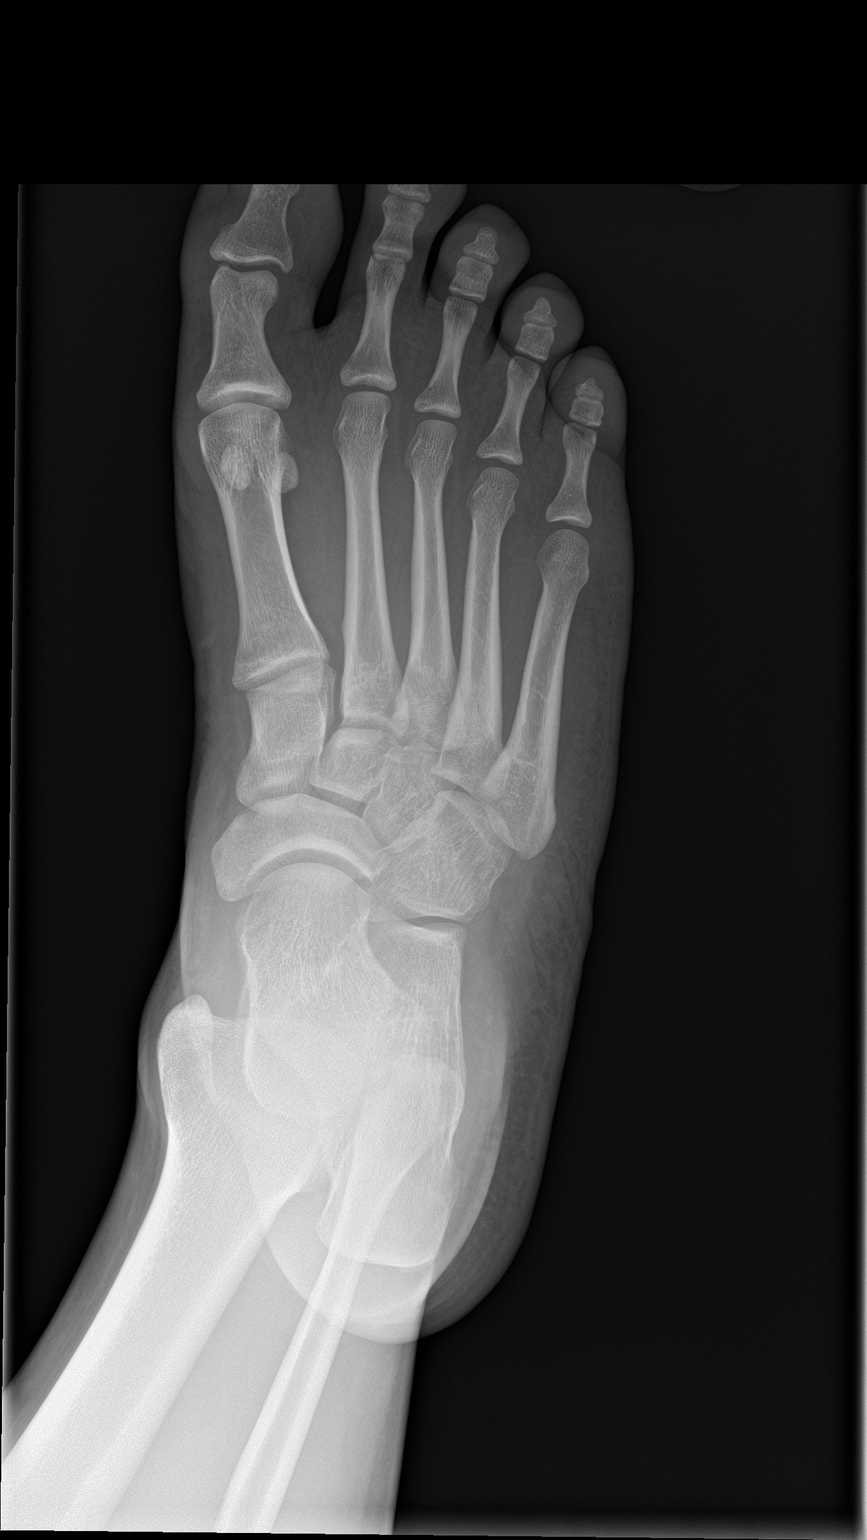

[foot lat]
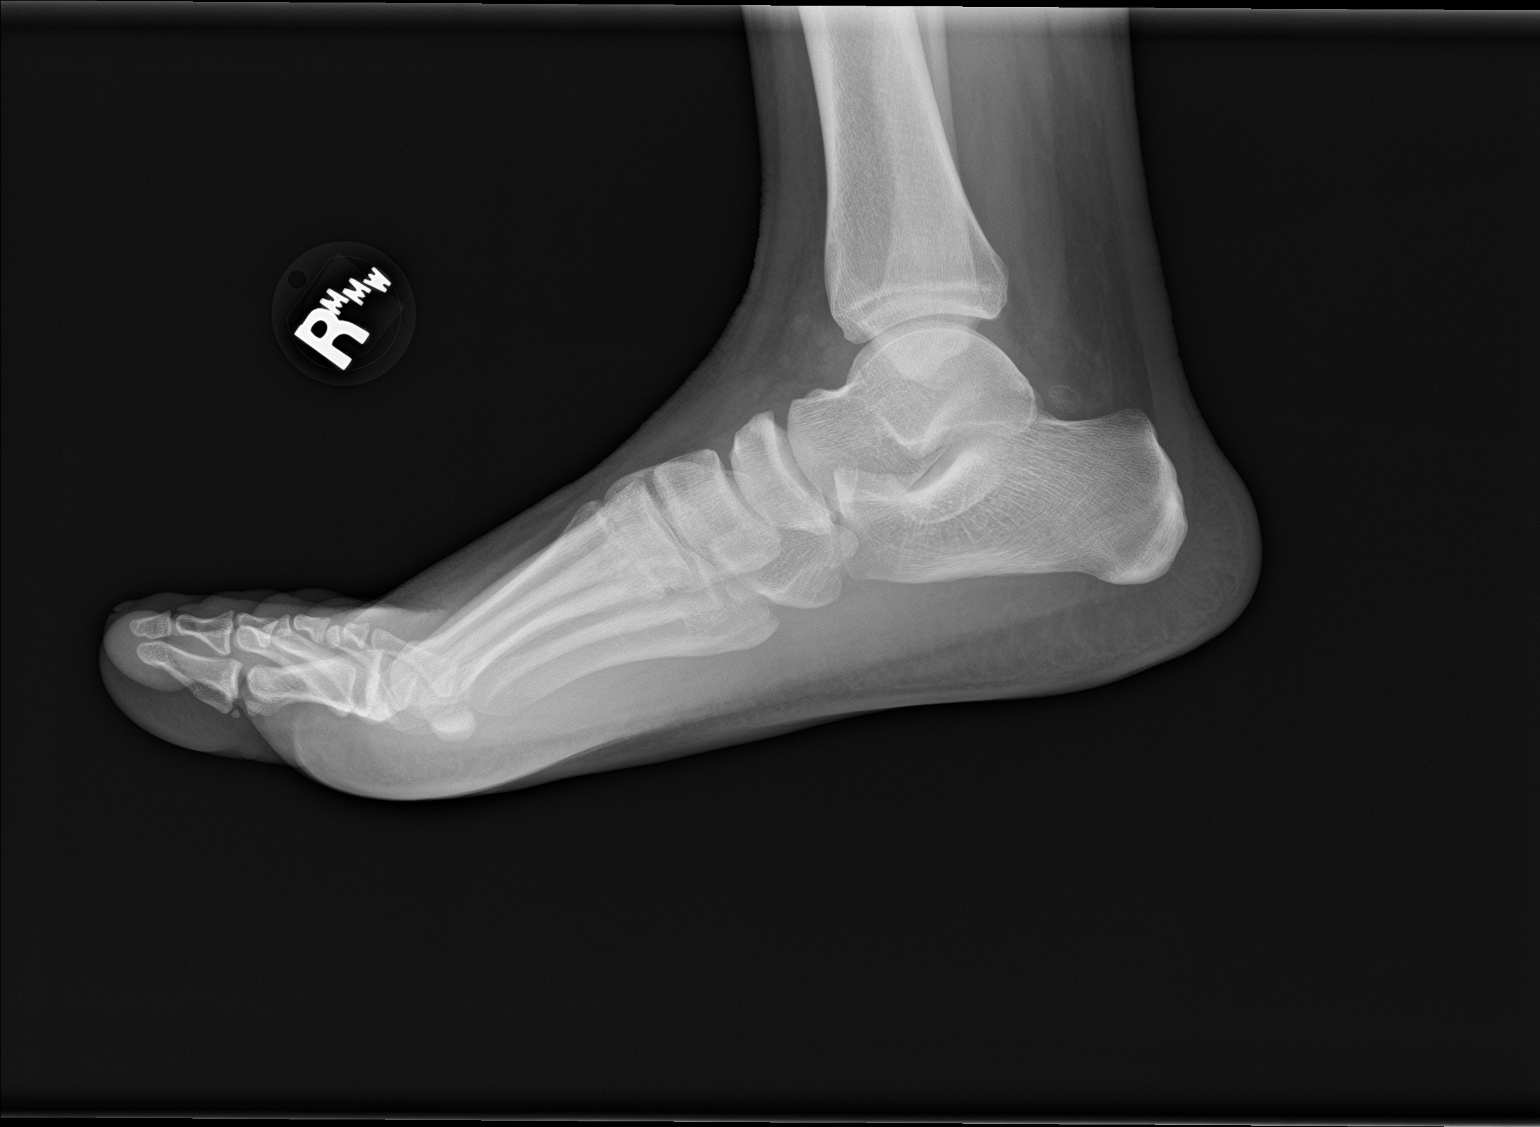

[3 of 3 positions shown; findings below may reference images not displayed]

FINDINGS: Frontal, oblique, and lateral views were obtained. There is no
fracture or dislocation. Joint spaces appear normal. No erosive
change.
IMPRESSION: No fracture or dislocation.  No evident arthropathy.
